# Patient Record
Sex: Female | Born: 1960 | Race: White | Hispanic: No | Marital: Married | Smoking: Never smoker
Health system: Southern US, Community
[De-identification: ages and names within clinical notes are randomized; demographics above are authoritative.]

## PROBLEM LIST (undated history)

## (undated) DIAGNOSIS — E785 Hyperlipidemia, unspecified: Secondary | ICD-10-CM

## (undated) DIAGNOSIS — E119 Type 2 diabetes mellitus without complications: Secondary | ICD-10-CM

## (undated) DIAGNOSIS — I10 Essential (primary) hypertension: Secondary | ICD-10-CM

## (undated) DIAGNOSIS — K7460 Unspecified cirrhosis of liver: Secondary | ICD-10-CM

## (undated) HISTORY — DX: Hyperlipidemia, unspecified: E78.5

## (undated) HISTORY — DX: Unspecified cirrhosis of liver: K74.60

---

## 2002-02-04 ENCOUNTER — Ambulatory Visit (HOSPITAL_COMMUNITY): Admission: RE | Admit: 2002-02-04 | Discharge: 2002-02-04 | Payer: Self-pay | Admitting: Family Medicine

## 2002-02-04 ENCOUNTER — Encounter: Payer: Self-pay | Admitting: Family Medicine

## 2004-01-22 ENCOUNTER — Ambulatory Visit (HOSPITAL_COMMUNITY): Admission: RE | Admit: 2004-01-22 | Discharge: 2004-01-22 | Payer: Self-pay | Admitting: Internal Medicine

## 2009-07-18 ENCOUNTER — Emergency Department (HOSPITAL_COMMUNITY): Admission: EM | Admit: 2009-07-18 | Discharge: 2009-07-18 | Payer: Self-pay | Admitting: Emergency Medicine

## 2010-05-16 ENCOUNTER — Observation Stay (HOSPITAL_COMMUNITY): Admission: EM | Admit: 2010-05-16 | Discharge: 2010-05-17 | Payer: Self-pay | Admitting: Emergency Medicine

## 2010-07-09 ENCOUNTER — Ambulatory Visit (HOSPITAL_COMMUNITY): Admission: RE | Admit: 2010-07-09 | Discharge: 2010-07-09 | Payer: Self-pay | Admitting: Cardiology

## 2010-12-20 LAB — BASIC METABOLIC PANEL
BUN: 7 mg/dL (ref 6–23)
CO2: 23 mEq/L (ref 19–32)
Calcium: 8.5 mg/dL (ref 8.4–10.5)
Chloride: 103 mEq/L (ref 96–112)
Creatinine, Ser: 0.67 mg/dL (ref 0.4–1.2)
GFR calc Af Amer: >60 mL/min (ref >60)
GFR calc non Af Amer: >60 mL/min (ref >60)
Glucose, Bld: 147 mg/dL — ABNORMAL HIGH (ref 70–99)
Potassium: 3.7 mEq/L (ref 3.5–5.1)
Sodium: 137 mEq/L (ref 135–145)

## 2010-12-20 LAB — DIFFERENTIAL
Eosinophils Relative: 1 % (ref 0–5)
Lymphocytes Relative: 12 % (ref 12–46)
Lymphs Abs: 1.4 10*3/uL (ref 0.7–4.0)
Neutro Abs: 10 10*3/uL — ABNORMAL HIGH (ref 1.7–7.7)
Neutrophils Relative %: 83 % — ABNORMAL HIGH (ref 43–77)

## 2010-12-20 LAB — CARDIAC PANEL(CRET KIN+CKTOT+MB+TROPI)
CK, MB: 0.7 ng/mL (ref 0.3–4.0)
Relative Index: INVALID (ref 0.0–2.5)
Total CK: 52 U/L (ref 7–177)
Troponin I: 0.01 ng/mL (ref 0.00–0.06)
Troponin I: 0.01 ng/mL (ref 0.00–0.06)

## 2010-12-20 LAB — CBC
MCV: 80 fL (ref 78.0–100.0)
Platelets: 274 10*3/uL (ref 150–400)
RBC: 5.05 MIL/uL (ref 3.87–5.11)
WBC: 12.1 10*3/uL — ABNORMAL HIGH (ref 4.0–10.5)

## 2010-12-20 LAB — LIPID PANEL
HDL: 36 mg/dL — ABNORMAL LOW (ref >39)
Total CHOL/HDL Ratio: 6.2 RATIO
Triglycerides: 453 mg/dL — ABNORMAL HIGH (ref <150)

## 2010-12-20 LAB — TSH: TSH: 2.302 u[IU]/mL (ref 0.350–4.500)

## 2010-12-20 LAB — POCT CARDIAC MARKERS
CKMB, poc: <1 ng/mL — ABNORMAL LOW (ref 1.0–8.0)
Myoglobin, poc: 47.3 ng/mL (ref 12–200)
Troponin i, poc: <0.05 ng/mL (ref 0.00–0.09)

## 2010-12-20 LAB — D-DIMER, QUANTITATIVE: D-Dimer, Quant: 0.22 ug/mL-FEU (ref 0.00–0.48)

## 2011-01-09 LAB — POCT CARDIAC MARKERS: Myoglobin, poc: 62.6 ng/mL (ref 12–200)

## 2011-09-04 ENCOUNTER — Other Ambulatory Visit (HOSPITAL_COMMUNITY): Payer: Self-pay | Admitting: Internal Medicine

## 2011-09-04 DIAGNOSIS — Z139 Encounter for screening, unspecified: Secondary | ICD-10-CM

## 2011-09-08 ENCOUNTER — Ambulatory Visit (HOSPITAL_COMMUNITY): Payer: Self-pay

## 2011-09-08 ENCOUNTER — Ambulatory Visit (HOSPITAL_COMMUNITY)
Admission: RE | Admit: 2011-09-08 | Discharge: 2011-09-08 | Disposition: A | Discharge status: Home / Self Care | Payer: BC Managed Care – PPO | Source: Ambulatory Visit | Attending: Internal Medicine | Admitting: Internal Medicine

## 2011-09-08 DIAGNOSIS — Z1231 Encounter for screening mammogram for malignant neoplasm of breast: Secondary | ICD-10-CM | POA: Insufficient documentation

## 2011-09-08 DIAGNOSIS — Z139 Encounter for screening, unspecified: Secondary | ICD-10-CM

## 2011-11-28 ENCOUNTER — Other Ambulatory Visit (HOSPITAL_COMMUNITY): Payer: Self-pay | Admitting: Internal Medicine

## 2011-11-28 ENCOUNTER — Ambulatory Visit (HOSPITAL_COMMUNITY)
Admission: RE | Admit: 2011-11-28 | Discharge: 2011-11-28 | Disposition: A | Discharge status: Home / Self Care | Payer: BC Managed Care – PPO | Source: Ambulatory Visit | Attending: Internal Medicine | Admitting: Internal Medicine

## 2011-11-28 DIAGNOSIS — R109 Unspecified abdominal pain: Secondary | ICD-10-CM

## 2011-11-28 DIAGNOSIS — R1011 Right upper quadrant pain: Secondary | ICD-10-CM | POA: Insufficient documentation

## 2011-11-28 DIAGNOSIS — R1031 Right lower quadrant pain: Secondary | ICD-10-CM | POA: Insufficient documentation

## 2012-10-06 HISTORY — PX: COLONOSCOPY: SHX174

## 2012-10-14 ENCOUNTER — Encounter (HOSPITAL_COMMUNITY): Payer: Self-pay | Admitting: Dietician

## 2012-10-14 NOTE — Progress Notes (Signed)
[facility] Diabetes Class Completion  Date:October 14, 2012  Time: 5:30 PM  Pt attended [facility]'s Diabetes Group Education Class on October 14, 2012.   Patient was educated on the following topics: survival skills (signs and symptoms of hyperglycemia and hypoglycemia, treatment for hypoglycemia, ideal levels for fasting and postprandial blood sugars, goal Hgb A1c level, foot care basics), recommendations for physical activity, carbohydrate metabolism in relation to diabetes, and meal planning (sources of carbohydrate, carbohydrate counting, meal planning strategies, food label reading, and portion control).   Melody Haver, RD, LDN

## 2013-01-11 ENCOUNTER — Telehealth: Payer: Self-pay

## 2013-01-11 NOTE — Telephone Encounter (Signed)
LM with spouse for pt to call.

## 2013-01-11 NOTE — Telephone Encounter (Signed)
Called, many rings and no answer.

## 2013-02-07 NOTE — Telephone Encounter (Signed)
Letter to pt and to PCP.  

## 2013-08-02 ENCOUNTER — Other Ambulatory Visit (HOSPITAL_COMMUNITY): Payer: Self-pay | Admitting: Physician Assistant

## 2013-08-02 DIAGNOSIS — Z139 Encounter for screening, unspecified: Secondary | ICD-10-CM

## 2013-08-15 ENCOUNTER — Ambulatory Visit (HOSPITAL_COMMUNITY)
Admission: RE | Admit: 2013-08-15 | Discharge: 2013-08-15 | Disposition: A | Discharge status: Home / Self Care | Payer: 59 | Source: Ambulatory Visit | Attending: Physician Assistant | Admitting: Physician Assistant

## 2013-08-15 DIAGNOSIS — Z139 Encounter for screening, unspecified: Secondary | ICD-10-CM

## 2013-08-15 DIAGNOSIS — Z1231 Encounter for screening mammogram for malignant neoplasm of breast: Secondary | ICD-10-CM | POA: Insufficient documentation

## 2015-11-05 ENCOUNTER — Encounter (HOSPITAL_COMMUNITY): Payer: Self-pay | Admitting: Emergency Medicine

## 2015-11-05 ENCOUNTER — Emergency Department (HOSPITAL_COMMUNITY)
Admission: EM | Admit: 2015-11-05 | Discharge: 2015-11-05 | Disposition: A | Discharge status: Home / Self Care | Payer: Managed Care, Other (non HMO) | Attending: Emergency Medicine | Admitting: Emergency Medicine

## 2015-11-05 ENCOUNTER — Emergency Department (HOSPITAL_COMMUNITY): Payer: Managed Care, Other (non HMO)

## 2015-11-05 DIAGNOSIS — E119 Type 2 diabetes mellitus without complications: Secondary | ICD-10-CM | POA: Insufficient documentation

## 2015-11-05 DIAGNOSIS — R110 Nausea: Secondary | ICD-10-CM | POA: Diagnosis not present

## 2015-11-05 DIAGNOSIS — M545 Low back pain: Secondary | ICD-10-CM | POA: Diagnosis not present

## 2015-11-05 DIAGNOSIS — M549 Dorsalgia, unspecified: Secondary | ICD-10-CM

## 2015-11-05 DIAGNOSIS — R300 Dysuria: Secondary | ICD-10-CM | POA: Diagnosis not present

## 2015-11-05 DIAGNOSIS — F419 Anxiety disorder, unspecified: Secondary | ICD-10-CM | POA: Diagnosis not present

## 2015-11-05 DIAGNOSIS — I10 Essential (primary) hypertension: Secondary | ICD-10-CM | POA: Insufficient documentation

## 2015-11-05 DIAGNOSIS — R52 Pain, unspecified: Secondary | ICD-10-CM

## 2015-11-05 HISTORY — DX: Type 2 diabetes mellitus without complications: E11.9

## 2015-11-05 HISTORY — DX: Essential (primary) hypertension: I10

## 2015-11-05 LAB — URINALYSIS, ROUTINE W REFLEX MICROSCOPIC
BILIRUBIN URINE: NEGATIVE
Glucose, UA: 100 mg/dL — AB
Leukocytes, UA: NEGATIVE
NITRITE: NEGATIVE
PH: 5 (ref 5.0–8.0)

## 2015-11-05 LAB — URINE MICROSCOPIC-ADD ON

## 2015-11-05 MED ORDER — CYCLOBENZAPRINE HCL 10 MG PO TABS: 10.0000 mg | ORAL_TABLET | Freq: Two times a day (BID) | ORAL | Status: DC | PRN

## 2015-11-05 MED ORDER — ONDANSETRON HCL 4 MG/2ML IJ SOLN
4.0000 mg | Freq: Once | INTRAMUSCULAR | Status: AC
  Administered 2015-11-05: 4 mg via INTRAVENOUS
  Filled 2015-11-05: qty 2

## 2015-11-05 MED ORDER — HYDROMORPHONE HCL 1 MG/ML IJ SOLN
1.0000 mg | Freq: Once | INTRAMUSCULAR | Status: AC
  Administered 2015-11-05: 1 mg via INTRAVENOUS
  Filled 2015-11-05: qty 1

## 2015-11-05 NOTE — ED Provider Notes (Signed)
CSN: [id number]     Arrival date & time 11/05/15  0618 History   First MD Initiated Contact with Patient 11/05/15 [phone]     Chief Complaint  Patient presents with  . Back Pain     Patient is a 55 y.o. female presenting with back pain. The history is provided by the patient and a significant other.  Back Pain Pain location: "low back" Quality:  Unable to specify Radiates to:  Does not radiate Pain severity:  Severe Onset quality:  Gradual Duration:  1 day Timing:  Constant Progression:  Worsening Chronicity:  New Relieved by:  Nothing Worsened by:  Nothing tried Associated symptoms: dysuria   Associated symptoms: no abdominal pain, no chest pain, no fever and no weakness   pt reports "nagging" back pain last week, thought she had UTI as she also had painful urination.  She took AZO with some relief.  However yesterday the BP returned and was very severe.  She does not recall having this pain before.   No fever/vomiting No new weakness reported No falls/injury reported  Past Medical History  Diagnosis Date  . Diabetes mellitus without complication (HCC)   . Hypertension    Past Surgical History  Procedure Laterality Date  . C-section x 2     History reviewed. No pertinent family history. Social History  Substance Use Topics  . Smoking status: Never Smoker   . Smokeless tobacco: None  . Alcohol Use: None   OB History    No data available     Review of Systems  Constitutional: Negative for fever.  Cardiovascular: Negative for chest pain.  Gastrointestinal: Positive for nausea. Negative for abdominal pain.  Genitourinary: Positive for dysuria.  Musculoskeletal: Positive for back pain.  Neurological: Negative for weakness.  All other systems reviewed and are negative.     Allergies  Review of patient's allergies indicates no known allergies.  Home Medications   Prior to Admission medications   Not on File   BP 166/104 mmHg  Pulse 118  Temp(Src) 98.5 F  (36.9 C) (Oral)  Resp 22  Ht  (1.499 m)  Wt 79.833 kg  BMI 35.53 kg/m2  SpO2 100%  LMP 10/24/2015 Physical Exam CONSTITUTIONAL: Well developed/well nourished HEAD: Normocephalic/atraumatic EYES: EOMI ENMT: Mucous membranes moist NECK: supple no meningeal signs SPINE/BACK:entire spine nontender, tenderness along right paraspinal region, no bruising or erythema noted CV: S1/S2 noted, no murmurs/rubs/gallops noted LUNGS: Lungs are clear to auscultation bilaterally, no apparent distress ABDOMEN: soft, nontender, no rebound or guarding, bowel sounds noted throughout abdomen GU:no cva tenderness NEURO: Pt is awake/alert/appropriate, moves all extremitiesx4.  No focal motor weakness noted to lower extremities, equal strength in both lower extremities EXTREMITIES: pulses normal/equal, full ROM SKIN: warm, color normal PSYCH: anxious  ED Course  Procedures  Initial exam difficult as pt very uncomfortable, unclear if this was ureteral stone vs. MSK pain CT scan negative Pt more comfortable Suspect musculoskeletal in nature Will give muscle relaxants No focal neuro deficits She is appropriate for d/c home Discussed strict return precautions Urine culture ordered, but will defer ABX for now  Labs Review Labs Reviewed  URINALYSIS, ROUTINE W REFLEX MICROSCOPIC (NOT AT [facility]) - Abnormal; Notable for the following:    Specific Gravity, Urine >1.030 (*)    Glucose, UA 100 (*)    Hgb urine dipstick SMALL (*)    Ketones, ur TRACE (*)    Protein, ur TRACE (*)    All other components within normal limits  URINE MICROSCOPIC-ADD ON - Abnormal; Notable for the following:    Squamous Epithelial / LPF 0-5 (*)    Bacteria, UA MANY (*)    All other components within normal limits  URINE CULTURE    Imaging Review Ct Renal Stone Study  11/05/2015  CLINICAL DATA:  Right flank pain for 1 day with macroscopic hematuria 11/28/2011 EXAM: CT ABDOMEN AND PELVIS WITHOUT CONTRAST TECHNIQUE:  Multidetector CT imaging of the abdomen and pelvis was performed following the standard protocol without IV contrast. COMPARISON:  None. FINDINGS: Lower chest:  Normal Hepatobiliary: Negative Pancreas: Negative Spleen: Multiple tiny low-attenuation foci unchanged and likely not of acute clinical significance Adrenals/Urinary Tract: Negative Stomach/Bowel: Tiny stone in the base the appendix with no evidence of dilatation or inflammation. Small and large bowel normal, as is the stomach Vascular/Lymphatic: Minimal aortic calcification. Mild iliac calcification. Reproductive: Stable 3.5 cm low-attenuation mass lower uterine segment possibly a fibroid not fully characterized without contrast Other: No ascites Musculoskeletal: No acute findings IMPRESSION: No acute abnormalities or findings to account for the patient's symptoms. Multiple nonacute findings described above. Electronically Signed   By: Esperanza Heir M.D.   On: 11/05/2015 08:01   I have personally reviewed and evaluated these lab results as part of my medical decision-making.   Medications  HYDROmorphone (DILAUDID) injection 1 mg (1 mg Intravenous Given 11/05/15 0717)  ondansetron (ZOFRAN) injection 4 mg (4 mg Intravenous Given 11/05/15 0717)  HYDROmorphone (DILAUDID) injection 1 mg (1 mg Intravenous Given 11/05/15 0811)    MDM   Final diagnoses:  Pain  Acute back pain    Nursing notes including past medical history and social history reviewed and considered in documentation Labs/vital reviewed myself and considered during evaluation     Zadie Rhine, MD 11/05/15 1610

## 2015-11-05 NOTE — Discharge Instructions (Signed)

## 2015-11-05 NOTE — ED Notes (Signed)
Pt states she has been having back pain since yesterday morning. States she thought she had a urinary tract infection last Thursday due to painful urination so she took some OTC AZO and "those symptoms went away".

## 2015-11-06 LAB — URINE CULTURE

## 2016-01-23 ENCOUNTER — Other Ambulatory Visit: Payer: Self-pay | Admitting: Registered Nurse

## 2016-01-23 DIAGNOSIS — Z1231 Encounter for screening mammogram for malignant neoplasm of breast: Secondary | ICD-10-CM

## 2016-01-24 ENCOUNTER — Ambulatory Visit
Admission: RE | Admit: 2016-01-24 | Discharge: 2016-01-24 | Disposition: A | Discharge status: Home / Self Care | Payer: Managed Care, Other (non HMO) | Source: Ambulatory Visit | Attending: Registered Nurse | Admitting: Registered Nurse

## 2016-01-24 DIAGNOSIS — Z1231 Encounter for screening mammogram for malignant neoplasm of breast: Secondary | ICD-10-CM

## 2016-04-09 ENCOUNTER — Encounter (HOSPITAL_COMMUNITY): Payer: Self-pay | Admitting: Emergency Medicine

## 2016-04-09 ENCOUNTER — Emergency Department (HOSPITAL_COMMUNITY)
Admission: EM | Admit: 2016-04-09 | Discharge: 2016-04-09 | Disposition: A | Discharge status: Home / Self Care | Payer: Managed Care, Other (non HMO) | Attending: Emergency Medicine | Admitting: Emergency Medicine

## 2016-04-09 DIAGNOSIS — Z79899 Other long term (current) drug therapy: Secondary | ICD-10-CM | POA: Insufficient documentation

## 2016-04-09 DIAGNOSIS — E119 Type 2 diabetes mellitus without complications: Secondary | ICD-10-CM | POA: Insufficient documentation

## 2016-04-09 DIAGNOSIS — I10 Essential (primary) hypertension: Secondary | ICD-10-CM | POA: Insufficient documentation

## 2016-04-09 DIAGNOSIS — Z7984 Long term (current) use of oral hypoglycemic drugs: Secondary | ICD-10-CM | POA: Insufficient documentation

## 2016-04-09 DIAGNOSIS — M5441 Lumbago with sciatica, right side: Secondary | ICD-10-CM | POA: Insufficient documentation

## 2016-04-09 DIAGNOSIS — M5431 Sciatica, right side: Secondary | ICD-10-CM

## 2016-04-09 MED ORDER — OXYCODONE-ACETAMINOPHEN 5-325 MG PO TABS
1.0000 | ORAL_TABLET | Freq: Once | ORAL | Status: AC
  Administered 2016-04-09: 1 via ORAL
  Filled 2016-04-09: qty 1

## 2016-04-09 MED ORDER — NAPROXEN 500 MG PO TABS: 500.0000 mg | ORAL_TABLET | Freq: Two times a day (BID) | ORAL | Status: DC

## 2016-04-09 MED ORDER — CYCLOBENZAPRINE HCL 10 MG PO TABS: 10.0000 mg | ORAL_TABLET | Freq: Two times a day (BID) | ORAL | Status: DC | PRN

## 2016-04-09 MED ORDER — OXYCODONE-ACETAMINOPHEN 5-325 MG PO TABS: 2.0000 | ORAL_TABLET | ORAL | Status: DC | PRN

## 2016-04-09 NOTE — ED Provider Notes (Signed)
CSN: [id number]     Arrival date & time 04/09/16  1131 History  By signing my name below, I, Freida Busmaniana Omoyeni, attest that this documentation has been prepared under the direction and in the presence of non-physician practitioner, Gaylyn RongSamantha Ellakate Gonsalves, PA-C. Electronically Signed: Freida Busmaniana Omoyeni, Scribe. 04/09/2016. 12:16 PM.  Chief Complaint  Patient presents with  . Hip Pain   The history is provided by the patient. No language interpreter was used.    HPI Comments:  [facility] is a 55 y.o. female who presents to the Emergency Department complaining of gradually worsening, lower back pain and right hip pain that radiates down her RLE x ~4 days. She denies recent fall/injury. Pt has been able to ambulate but reports increased pain when doing so. She reports associated tingling in her right hip/thigh area. She has been taking ibuprofen with mild relief  She reports h/o similar episode ~ 7 months ago; states she was evaluated and treated with steroids and muscle relaxer with relief. She denies bowel/bladder incontinence, LLE pain/numbness, fever and chills.   Past Medical History  Diagnosis Date  . Diabetes mellitus without complication (HCC)   . Hypertension    Past Surgical History  Procedure Laterality Date  . C-section x 2     No family history on file. Social History  Substance Use Topics  . Smoking status: Never Smoker   . Smokeless tobacco: None  . Alcohol Use: No   OB History    No data available     Review of Systems  10 systems reviewed and all are negative for acute change except as noted in the HPI.  Allergies  Review of patient's allergies indicates no known allergies.  Home Medications   Prior to Admission medications   Medication Sig Start Date End Date Taking? Authorizing Provider  atorvastatin (LIPITOR) 10 MG tablet Take 1 tablet by mouth daily. 10/06/15   Historical Provider, MD  Coenzyme Q10 (CO Q 10 PO) Take 2 tablets by mouth daily.    Historical Provider,  MD  cyclobenzaprine (FLEXERIL) 10 MG tablet Take 1 tablet (10 mg total) by mouth 2 (two) times daily as needed for muscle spasms. 11/05/15   Zadie Rhineonald Wickline, MD  cyclobenzaprine (FLEXERIL) 10 MG tablet Take 1 tablet (10 mg total) by mouth 2 (two) times daily as needed for muscle spasms. 04/09/16   Keano Guggenheim Tripp Mekaylah Klich, PA-C  enalapril-hydrochlorothiazide (VASERETIC) 10-25 MG tablet Take 1 tablet by mouth daily. 10/06/15   Historical Provider, MD  ibuprofen (ADVIL,MOTRIN) 200 MG tablet Take 400-800 mg by mouth every 6 (six) hours as needed for headache or moderate pain.    Historical Provider, MD  metFORMIN (GLUCOPHAGE-XR) 500 MG 24 hr tablet Take 500 mg by mouth 3 (three) times daily. 10/04/15   Historical Provider, MD  naproxen (NAPROSYN) 500 MG tablet Take 1 tablet (500 mg total) by mouth 2 (two) times daily. 04/09/16   Norah Devin Tripp Genevive Printup, PA-C  oxyCODONE-acetaminophen (PERCOCET/ROXICET) 5-325 MG tablet Take 2 tablets by mouth every 4 (four) hours as needed for severe pain. 04/09/16   Valyn Latchford Tripp Kilian Schwartz, PA-C   BP 149/68 mmHg  Pulse 81  Temp(Src) 98.2 F (36.8 C) (Oral)  Resp 20  SpO2 100%  LMP 03/31/2016 Physical Exam  Constitutional: She is oriented to person, place, and time. She appears well-developed and well-nourished. No distress.  HENT:  Head: Normocephalic and atraumatic.  Eyes: Conjunctivae are normal. Right eye exhibits no discharge. Left eye exhibits no discharge. No scleral icterus.  Cardiovascular:  Normal rate.   Pulmonary/Chest: Effort normal.  Musculoskeletal: Normal range of motion. She exhibits tenderness.  No midline spinal tenderness TTP over right lumbar paraspinal muscles and gluteus FROM of C/T/L spine  No step off or obvious deformity   Neurological: She is alert and oriented to person, place, and time. Coordination normal.  Strength 5/5 throughout. No sensory deficits. No gait abnormality  Skin: Skin is warm and dry. No rash noted. She is not diaphoretic.  No erythema. No pallor.  Psychiatric: She has a normal mood and affect. Her behavior is normal.  Nursing note and vitals reviewed.   ED Course  Procedures   DIAGNOSTIC STUDIES:  Oxygen Saturation is 100% on RA, normal by my interpretation.    COORDINATION OF CARE:  12:00 PM Discussed treatment plan with pt at bedside and pt agreed to plan.   MDM   Final diagnoses:  Sciatica of right side   Patient with back pain and hip pain, similar to previous episodes of sciatica.  No neurological deficits and normal neuro exam.  Patient is ambulatory. No loss of bowel or bladder control.  No concern for cauda equina.  No fever, night sweats, weight loss, h/o cancer, IVDA, no recent procedure to back. No urinary symptoms suggestive of UTI.  Pain managed in ED. Rx for Flexeril, percocet given. Recommend NSAIDS. Supportive care and return precaution discussed. Appears safe for discharge at this time. Follow up as indicated in discharge paperwork.   I personally performed the services described in this documentation, which was scribed in my presence. The recorded information has been reviewed and is accurate.     Lester KinsmanSamantha Tripp ChallisDowless, PA-C 04/09/16 1600  Glynn OctaveStephen Rancour, MD 04/09/16 [phone]

## 2016-04-09 NOTE — Discharge Instructions (Signed)
Sciatica °Sciatica is pain, weakness, numbness, or tingling along the path of the sciatic nerve. The nerve starts in the lower back and runs down the back of each leg. The nerve controls the muscles in the lower leg and in the back of the knee, while also providing sensation to the back of the thigh, lower leg, and the sole of your foot. Sciatica is a symptom of another medical condition. For instance, nerve damage or certain conditions, such as a herniated disk or bone spur on the spine, pinch or put pressure on the sciatic nerve. This causes the pain, weakness, or other sensations normally associated with sciatica. Generally, sciatica only affects one side of the body. °CAUSES  °· Herniated or slipped disc. °· Degenerative disk disease. °· A pain disorder involving the narrow muscle in the buttocks (piriformis syndrome). °· Pelvic injury or fracture. °· Pregnancy. °· Tumor (rare). °SYMPTOMS  °Symptoms can vary from mild to very severe. The symptoms usually travel from the low back to the buttocks and down the back of the leg. Symptoms can include: °· Mild tingling or dull aches in the lower back, leg, or hip. °· Numbness in the back of the calf or sole of the foot. °· Burning sensations in the lower back, leg, or hip. °· Sharp pains in the lower back, leg, or hip. °· Leg weakness. °· Severe back pain inhibiting movement. °These symptoms may get worse with coughing, sneezing, laughing, or prolonged sitting or standing. Also, being overweight may worsen symptoms. °DIAGNOSIS  °Your caregiver will perform a physical exam to look for common symptoms of sciatica. He or she may ask you to do certain movements or activities that would trigger sciatic nerve pain. Other tests may be performed to find the cause of the sciatica. These may include: °· Blood tests. °· X-rays. °· Imaging tests, such as an MRI or CT scan. °TREATMENT  °Treatment is directed at the cause of the sciatic pain. Sometimes, treatment is not necessary  and the pain and discomfort goes away on its own. If treatment is needed, your caregiver may suggest: °· Over-the-counter medicines to relieve pain. °· Prescription medicines, such as anti-inflammatory medicine, muscle relaxants, or narcotics. °· Applying heat or ice to the painful area. °· Steroid injections to lessen pain, irritation, and inflammation around the nerve. °· Reducing activity during periods of pain. °· Exercising and stretching to strengthen your abdomen and improve flexibility of your spine. Your caregiver may suggest losing weight if the extra weight makes the back pain worse. °· Physical therapy. °· Surgery to eliminate what is pressing or pinching the nerve, such as a bone spur or part of a herniated disk. °HOME CARE INSTRUCTIONS  °· Only take over-the-counter or prescription medicines for pain or discomfort as directed by your caregiver. °· Apply ice to the affected area for 20 minutes, 3-4 times a day for the first 48-72 hours. Then try heat in the same way. °· Exercise, stretch, or perform your usual activities if these do not aggravate your pain. °· Attend physical therapy sessions as directed by your caregiver. °· Keep all follow-up appointments as directed by your caregiver. °· Do not wear high heels or shoes that do not provide proper support. °· Check your mattress to see if it is too soft. A firm mattress may lessen your pain and discomfort. °SEEK IMMEDIATE MEDICAL CARE IF:  °· You lose control of your bowel or bladder (incontinence). °· You have increasing weakness in the lower back, pelvis, buttocks,   or legs.  You have redness or swelling of your back.  You have a burning sensation when you urinate.  You have pain that gets worse when you lie down or awakens you at night.  Your pain is worse than you have experienced in the past.  Your pain is lasting longer than 4 weeks.  You are suddenly losing weight without reason. MAKE SURE YOU:  Understand these  instructions.  Will watch your condition.  Will get help right away if you are not doing well or get worse.   This information is not intended to replace advice given to you by your health care provider. Make sure you discuss any questions you have with your health care provider.  Apply ice to affected area. Take percocet as needed for pain. Take Flexeril for muscle relaxation. This may make you feel drowsy. Do not drive while taking Flexeril or Percocet. Take naprosyn twice daily for inflammation. Encourage stretching and massaging of back. Return to the ED if you experience severe worsening of your pain, loss of control of your bowels or bladder, numbness/tingling in both of your lower extremities, difficulty urinating or fevers.

## 2016-04-09 NOTE — ED Notes (Signed)
PT reports right hip pain that radiates down right leg. PT denies any injury or fall. PT reports pain started Sunday and has not improved at all since then.

## 2016-04-09 NOTE — ED Notes (Signed)
PT made aware that she cannot drive or operate machinery for the rest of the day or while continuing to use percocet or flexiril for pain.

## 2017-03-03 IMAGING — CT CT RENAL STONE PROTOCOL
2 of 4 series · 17 of 46 positions shown, 19 images · non-contrast
Comparison: None.

CLINICAL DATA: Right flank pain for 1 day with macroscopic
hematuria 11/28/2011

EXAM:
CT ABDOMEN AND PELVIS WITHOUT CONTRAST
TECHNIQUE: Multidetector CT imaging of the abdomen and pelvis was performed
following the standard protocol without IV contrast.

[Series 2: standard/full over (age)lbs 5.0 · axial · 0.77mm/px · z∈[-427,-2]mm · 14 of 95 slices shown, 16 images]
[im 5/95  soft-tissue]
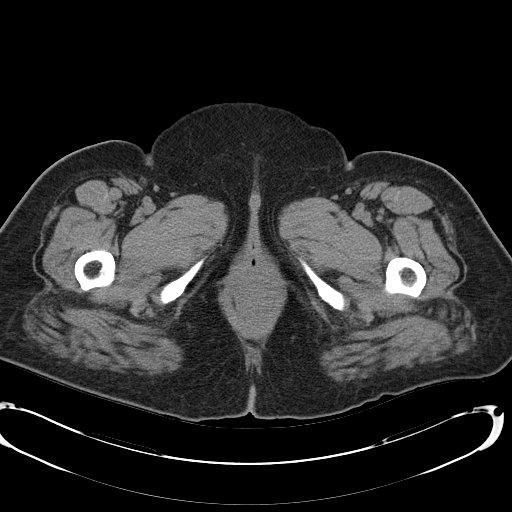
[im 5/95  bone]
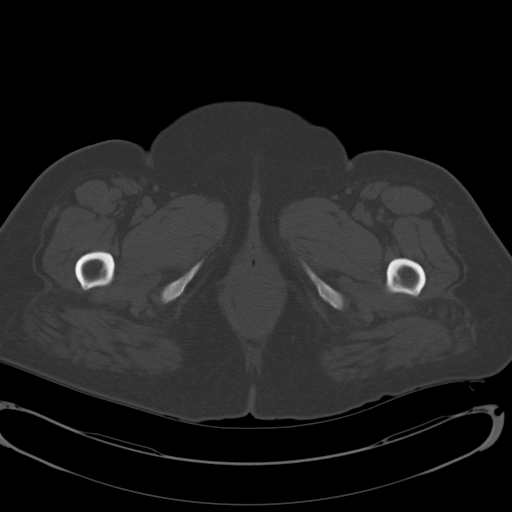
[im 13/95  soft-tissue]
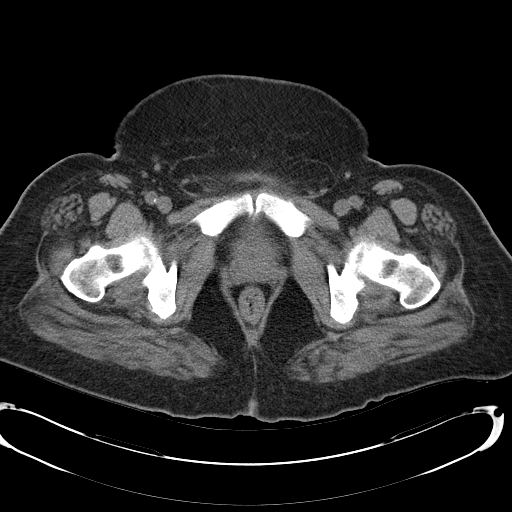
[im 17/95  soft-tissue]
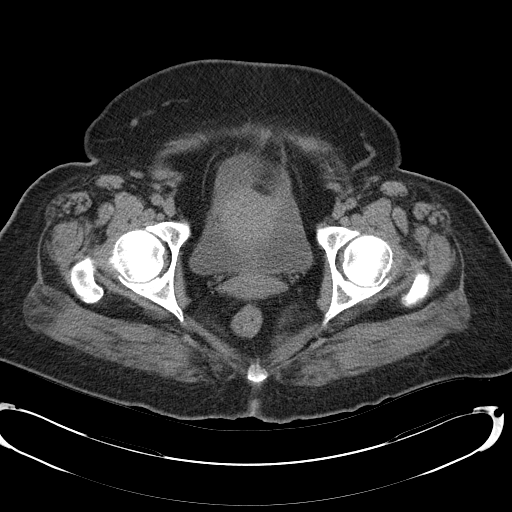
[im 25/95  soft-tissue]
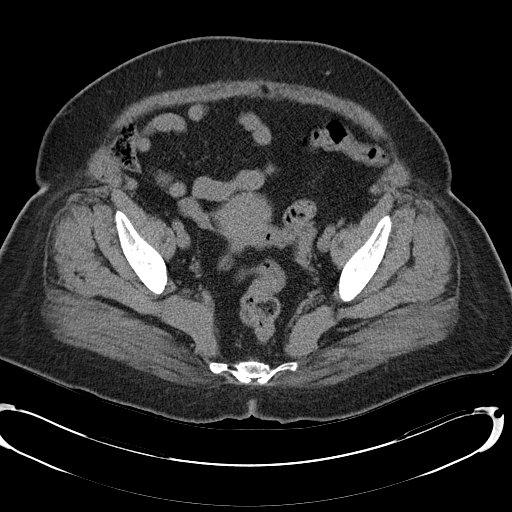
[im 33/95  soft-tissue]
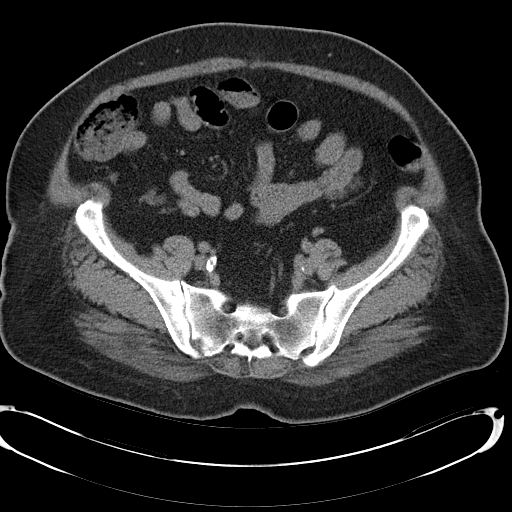
[im 37/95  soft-tissue]
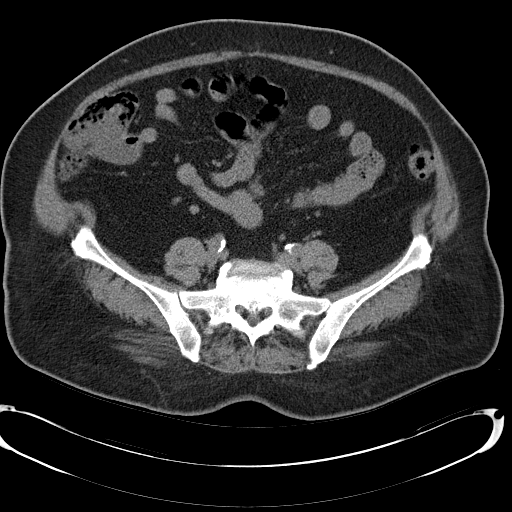
[im 45/95  soft-tissue]
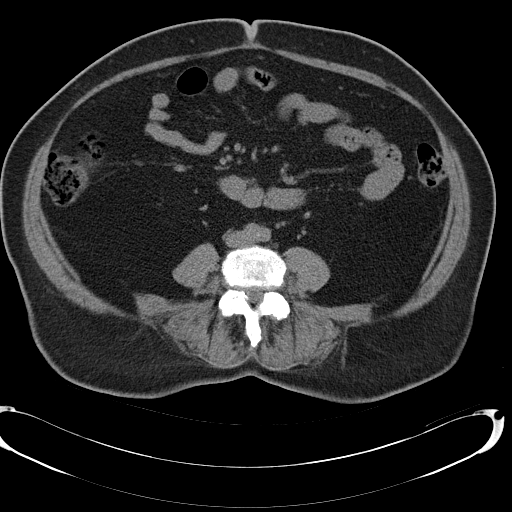
[im 50/95  soft-tissue]
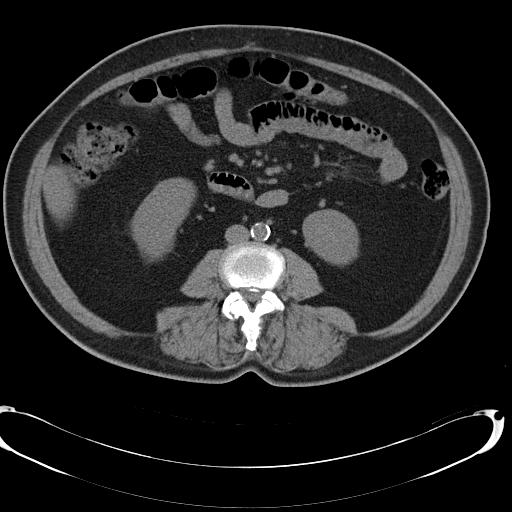
[im 58/95  soft-tissue]
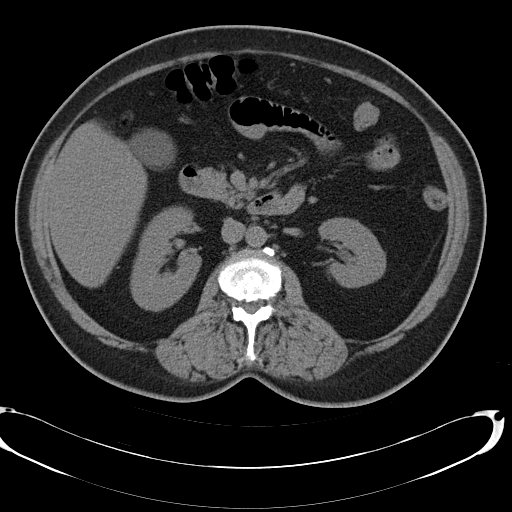
[im 58/95  bone]
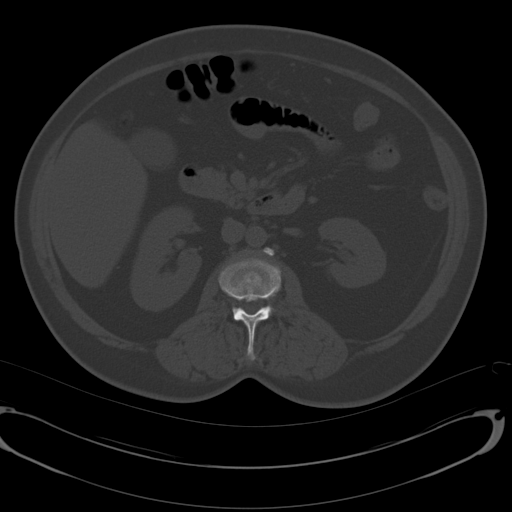
[im 62/95  soft-tissue]
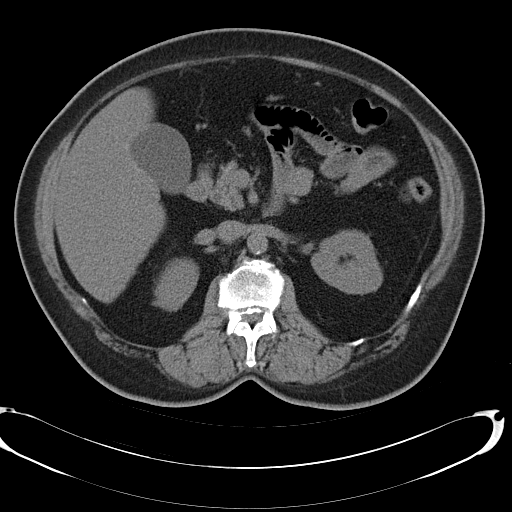
[im 70/95  soft-tissue]
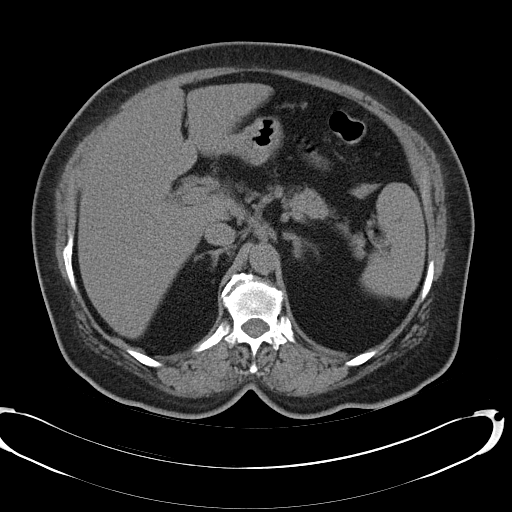
[im 78/95  soft-tissue]
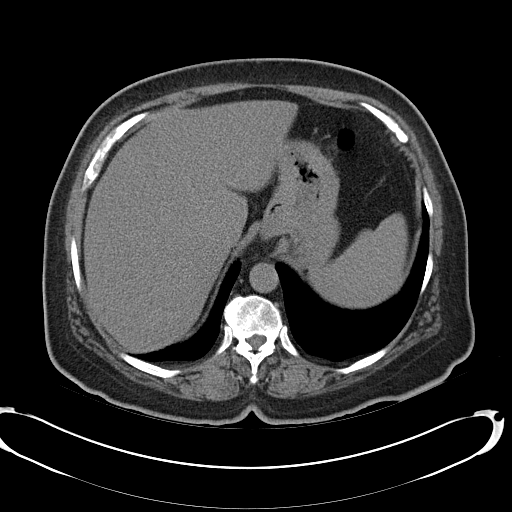
[im 82/95  soft-tissue]
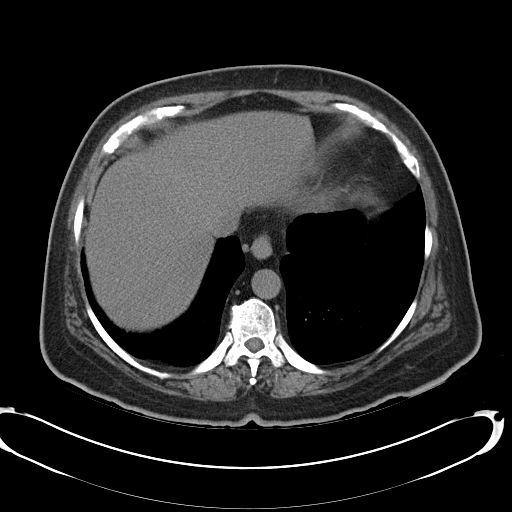
[im 90/95  soft-tissue]
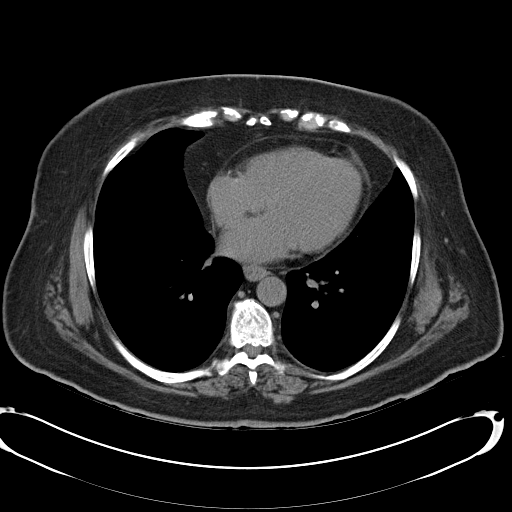

[Series 3: mpr coronal · coronal · 0.78mm/px · 3 of 102 slices shown]
[im 34/102  soft-tissue]
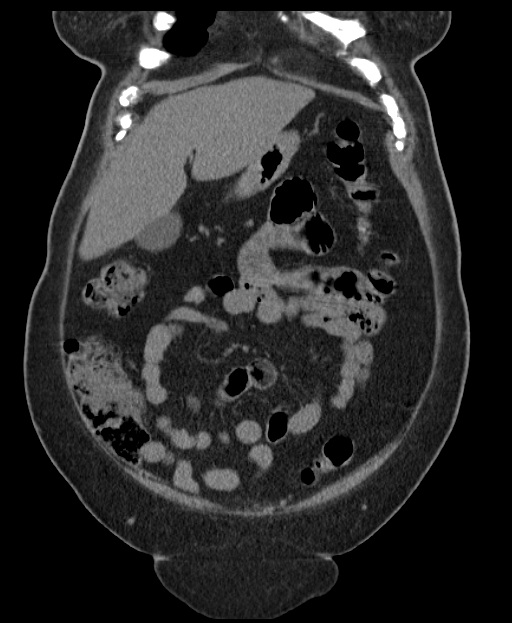
[im 45/102  soft-tissue]
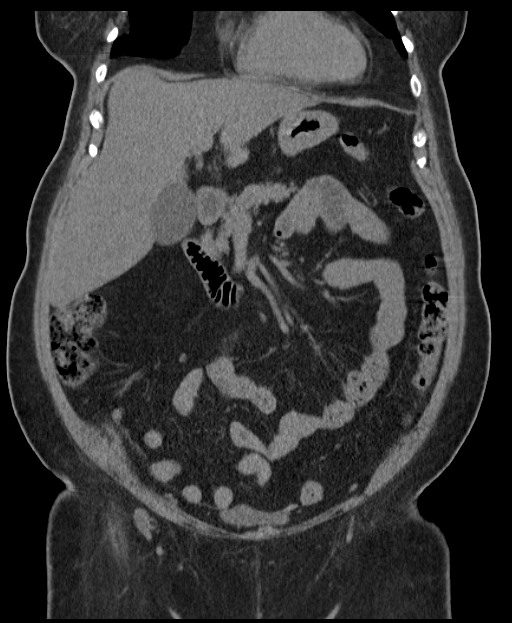
[im 57/102  soft-tissue]
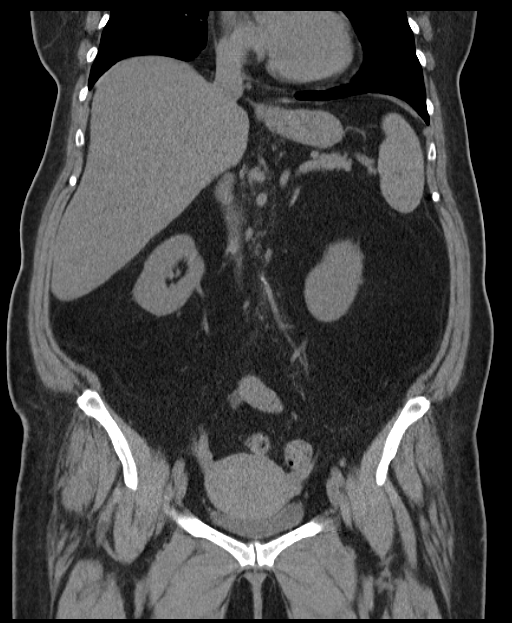

[17 of 46 positions shown; findings below may reference images not displayed]

FINDINGS: Lower chest:  Normal

Hepatobiliary: Negative

Pancreas: Negative

Spleen: Multiple tiny low-attenuation foci unchanged and likely not
of acute clinical significance

Adrenals/Urinary Tract: Negative

Stomach/Bowel: Tiny stone in the base the appendix with no evidence
of dilatation or inflammation. Small and large bowel normal, as is
the stomach

Vascular/Lymphatic: Minimal aortic calcification. Mild iliac
calcification.

Reproductive: Stable 3.5 cm low-attenuation mass lower uterine
segment possibly a fibroid not fully characterized without contrast

Other: No ascites

Musculoskeletal: No acute findings
IMPRESSION: No acute abnormalities or findings to account for the patient's
symptoms. Multiple nonacute findings described above.

## 2020-08-06 HISTORY — PX: OTHER SURGICAL HISTORY: SHX169

## 2020-12-24 ENCOUNTER — Other Ambulatory Visit (HOSPITAL_COMMUNITY): Payer: Self-pay | Admitting: Internal Medicine

## 2020-12-24 DIAGNOSIS — R7989 Other specified abnormal findings of blood chemistry: Secondary | ICD-10-CM

## 2020-12-24 DIAGNOSIS — R945 Abnormal results of liver function studies: Secondary | ICD-10-CM

## 2020-12-31 ENCOUNTER — Ambulatory Visit (HOSPITAL_COMMUNITY)
Admission: RE | Admit: 2020-12-31 | Discharge: 2020-12-31 | Disposition: A | Discharge status: Home / Self Care | Payer: 59 | Source: Ambulatory Visit | Attending: Internal Medicine | Admitting: Internal Medicine

## 2020-12-31 DIAGNOSIS — R7989 Other specified abnormal findings of blood chemistry: Secondary | ICD-10-CM

## 2020-12-31 DIAGNOSIS — R945 Abnormal results of liver function studies: Secondary | ICD-10-CM | POA: Insufficient documentation

## 2021-01-09 ENCOUNTER — Encounter: Payer: Self-pay | Admitting: Internal Medicine

## 2021-02-04 NOTE — Progress Notes (Signed)
Referring Provider: Redmond School, MD Primary Care Physician:  Redmond School, MD Primary Gastroenterologist:  Dr. Gala Romney  Chief Complaint  Patient presents with  . Cirrhosis    possible    HPI:   Caroline Solis is a 60 y.o. female presenting today at the request of Redmond School, MD for possible cirrhosis.  Abdominal ultrasound on file 12/31/2020 for elevated LFTs.  No focal liver lesion.  Liver echogenicity is somewhat coarsened and increased.  Subtle nodularity along the liver contour.  Patent portal vein.  Spleen normal.  No ascites.   Chronic history of fatty liver. Previously noted on [place] in October 2011 with hepatomegaly at that time.   Review plan referral completed 12/19/2020. 13.9, platelets 279.  AST 48 (H), ALT 57 (H), alk phos 61, total bilirubin 0.4.  Kidney function and electrolytes within normal limits.  Glucose elevated at 135. Hepatitis A antibody IgM negative, hepatitis B surface antigen negative, hepatitis B core antibody IgM negative, hepatitis C antibody.  Today:  No history of liver disease.  States primary care has been following mildly elevated liver enzymes for over a year.  Also secondary to Lipitor.  No personal history of alcohol or drug use.  Refer does have history of alcoholic cirrhosis.  He is deceased.  She does take a few over-the-counter supplements which are listed on her medication list.  No herbal teas.  No regular Tylenol use.  Ibuprofen a couple times a week for knee and foot pain.  No family history of autoimmune conditions.   Denies swelling in the lower extremities, abdomen, yellowing of the eyes or skin, changes in mental status or confusion, itching, rash, easy bruising, blood per rectum, or melena.  Last colonoscopy 6-7 years ago at [place]. Had a few polyps. She was told to come back in 10 years.  EGD many many years ago for reflux.   Denies GERD symptoms or dysphagia.  Denies constipation or diarrhea.   Past Medical History:   Diagnosis Date  . Diabetes mellitus without complication ([place])   . HLD (hyperlipidemia)   . Hypertension     Past Surgical History:  Procedure Laterality Date  . c-section X 2    . COLONOSCOPY     [facility]  . right ankle surgery  08/2020   screws and plate in place    Current Outpatient Medications  Medication Sig Dispense Refill  . aspirin EC 81 MG tablet Take 81 mg by mouth daily. Swallow whole.    Marland Kitchen atorvastatin (LIPITOR) 20 MG tablet Take 20 mg by mouth daily.    Marland Kitchen BIOTIN 5000 PO Take 2 capsules by mouth daily.    . calcium carbonate (CALCIUM 600) 600 MG TABS tablet Take 1,200 mg by mouth daily.    . Cholecalciferol (VITAMIN D3) 125 MCG (5000 UT) TABS Take 1 tablet by mouth daily.    . Coenzyme Q10 (CO Q 10 PO) Take 2 tablets by mouth daily.    . enalapril (VASOTEC) 20 MG tablet Take 20 mg by mouth daily.    . hydrochlorothiazide (HYDRODIURIL) 25 MG tablet Take 1 tablet by mouth daily.    Marland Kitchen ibuprofen (ADVIL,MOTRIN) 200 MG tablet Take 400-800 mg by mouth every 6 (six) hours as needed for headache or moderate pain.    . metFORMIN (GLUCOPHAGE-XR) 500 MG 24 hr tablet Take 1,000 mg by mouth 2 (two) times daily.  0  . Omega-3 Fatty Acids (FISH OIL) 1000 MG CAPS Take 3 capsules by mouth daily.    Marland Kitchen  vitamin k 100 MCG tablet Take 100 mcg by mouth daily.    . Zinc 50 MG TABS Take 1 tablet by mouth daily.     No current facility-administered medications for this visit.    Allergies as of 02/06/2021  . (No Known Allergies)    Family History  Problem Relation Age of Onset  . Cirrhosis Father        secondary to alcohol  . Colon cancer Neg Hx     Social History   Socioeconomic History  . Marital status: Married    Spouse name: Not on file  . Number of children: Not on file  . Years of education: Not on file  . Highest education level: Not on file  Occupational History  . Not on file  Tobacco Use  . Smoking status: Never Smoker  . Smokeless tobacco: Never Used   Substance and Sexual Activity  . Alcohol use: No  . Drug use: No  . Sexual activity: Not on file  Other Topics Concern  . Not on file  Social History Narrative  . Not on file   Social Determinants of Health   Financial Resource Strain: Not on file  Food Insecurity: Not on file  Transportation Needs: Not on file  Physical Activity: Not on file  Stress: Not on file  Social Connections: Not on file  Intimate Partner Violence: Not on file    Review of Systems: Gen: Denies any fever, chills, cold or flulike symptoms, presyncope, syncope.    CV: Denies chest pain or heart palpitations Resp: Denies shortness of breath cough.  GI: See HPI GU : Denies urinary burning, urinary frequency, urinary hesitancy MS: See HPI Derm: Denies rash or itching Psych: Denies depression or anxiety Heme: See HPI  Physical Exam: BP 139/89   Pulse 100   Temp (!) 96.8 F (36 C) (Temporal)   Ht 5' (1.524 m)   Wt 152 lb 3.2 oz (69 kg)   LMP 03/31/2016   BMI 29.72 kg/m  General:   Alert and oriented. Pleasant and cooperative. Well-nourished and well-developed.  Head:  Normocephalic and atraumatic. Eyes:  Without icterus, sclera clear and conjunctiva pink.  Ears:  Normal auditory acuity. Lungs:  Clear to auscultation bilaterally. No wheezes, rales, or rhonchi. No distress.  Heart:  S1, S2 present without murmurs appreciated.  Abdomen:  +BS, soft, non-tender and non-distended. No HSM noted. No guarding or rebound. No masses appreciated.  Rectal:  Deferred  Msk:  Symmetrical without gross deformities. Normal posture. Extremities:  Without edema. Neurologic:  Alert and  oriented x4;  grossly normal neurologically. Skin:  Intact without significant lesions or rashes. Psych:  Normal mood and affect.   Assessment: 60 year old female with history of diabetes, HTN, HLD presenting today for request of Dr. Gerarda Fraction for further evaluation of elevated LFTs and possible cirrhosis.  Labs including referral  revealed AST 48 (H), ALT 57 (H), alk phos 61, total bilirubin 0.4, platelets within normal limits.  Acute hepatitis panel with A, B, and C negative.  Abdominal ultrasound on file 12/31/2020 with liver echogenicity somewhat coarsened and increased, subtle nodularity along the liver contour, patent portal vein, spleen normal, no ascites.  Prior ultrasound in October 2011 with hepatomegaly and fatty liver. Clinically, patient has no signs or symptoms of decompensated liver disease.  No other significant upper or lower GI symptoms.  No history of alcohol or drug use.  Family history significant for father with alcoholic cirrhosis.  No known family history of autoimmune  conditions or other liver disease.    Suspect early cirrhosis and elevated LFTs secondary to NASH. Lipitor may also be contributing to elevated LFTs. We will update cirrhosis labs and screen for hemochromatosis and autoimmune conditions.  We will also evaluate for hepatitis A and B immunity.  Recommend EGD to evaluate for evidence of portal hypertension/esophageal varices.  We will hold off on scheduling this until reviewing prior colonoscopy records as she may be due for repeat colonoscopy as well.   History of colon polyps: Patient reports a personal history of colon polyps with last colonoscopy 6 or 7 years ago at [place].  Reports she was told to repeat colonoscopy in 10 years.  No family history of colon cancer.  No significant lower GI symptoms or alarm symptoms.  Requesting colonoscopy records were reviewed to determine timing of her next procedure.  Plan:  1.  CBC, CMP, INR, AFP, ANA, ANA, ASMA, immunoglobulins, iron panel and ferritin, hepatitis A antibody total, hepatitis B surface antibody, hepatitis B core antibody total.  2.  Recommend EGD to screen for portal hypertension/esiohageal varices. Hold off on scheduling until we have reviewed last [facility] records as well.   3.  Request colonoscopy records from [facility] to determine timing  of next colonoscopy.   4.  Recommend 1-2# weight loss per week until ideal body weight through exercise & diet. Low fat/cholesterol diet.   Avoid sweets, sodas, fruit juices, sweetened beverages like tea, etc. Gradually increase exercise from 15 min daily up to 1 hr per day 5 days/week.  5. 2g sodium diet.   6.  Advised to monitor for swelling in her lower extremities, swelling in her abdomen, yellowing of her eyes or skin, changes in mental status/confusion, easy bruising, bright red blood per rectum, or black stools and let us know if this occurs.  7. Next RUQ [place] in September 2022.  8.  Will likely follow-up after procedures.      Aliene Altes, PA-C [facility] 02/06/2021

## 2021-02-06 ENCOUNTER — Encounter: Payer: Self-pay | Admitting: Gastroenterology

## 2021-02-06 ENCOUNTER — Other Ambulatory Visit: Payer: Self-pay

## 2021-02-06 ENCOUNTER — Ambulatory Visit (INDEPENDENT_AMBULATORY_CARE_PROVIDER_SITE_OTHER): Payer: 59 | Admitting: Gastroenterology

## 2021-02-06 VITALS — BP 139/89 | HR 100 | Temp 96.8°F | Ht 60.0 in | Wt 152.2 lb

## 2021-02-06 DIAGNOSIS — Z86010 Personal history of colonic polyps: Secondary | ICD-10-CM | POA: Diagnosis not present

## 2021-02-06 DIAGNOSIS — R7989 Other specified abnormal findings of blood chemistry: Secondary | ICD-10-CM

## 2021-02-06 DIAGNOSIS — K7469 Other cirrhosis of liver: Secondary | ICD-10-CM | POA: Insufficient documentation

## 2021-02-06 DIAGNOSIS — K7460 Unspecified cirrhosis of liver: Secondary | ICD-10-CM | POA: Diagnosis not present

## 2021-02-06 NOTE — Patient Instructions (Addendum)
Please have labs completed at [facility].  We will call you with results.  Recommend 1-2# weight loss per week until ideal body weight through exercise & diet. Low fat/cholesterol diet.   Avoid sweets, sodas, fruit juices, sweetened beverages like tea, etc. Gradually increase exercise from 15 min daily up to 1 hr per day 5 days/week.  Recommend a low sodium diet. No more than 2000 mg per day. This includes everything you eat and drink.   Monitor for swelling in her lower extremities, swelling in her abdomen, yellowing of her eyes or skin, changes in mental status/confusion, easy bruising, bright red blood per rectum, or black stools and let us know if this occurs.  We are requesting a colonoscopy records from [facility].  Once I have received and reviewed these records, will reach out to you to let you know when your next colonoscopy is doing.  We will also schedule an upper endoscopy at that time.   Do not hesitate to reach back out to our office in a couple of weeks if he has not heard anything about scheduling a colonoscopy or upper endoscopy.   It was a pleasure to meet you today!   Ermalinda Memos, PA-C [facility]    Low-Sodium Eating Plan Sodium, which is an element that makes up salt, helps you maintain a healthy balance of fluids in your body. Too much sodium can increase your blood pressure and cause fluid and waste to be held in your body. Your health care provider or dietitian may recommend following this plan if you have high blood pressure (hypertension), kidney disease, liver disease, or heart failure. Eating less sodium can help lower your blood pressure, reduce swelling, and protect your heart, liver, and kidneys. What are tips for following this plan? Reading food labels  The Nutrition Facts label lists the amount of sodium in one serving of the food. If you eat more than one serving, you must multiply the listed amount of sodium by the number of  servings.  Choose foods with less than 140 mg of sodium per serving.  Avoid foods with 300 mg of sodium or more per serving. Shopping  Look for lower-sodium products, often labeled as "low-sodium" or "no salt added."  Always check the sodium content, even if foods are labeled as "unsalted" or "no salt added."  Buy fresh foods. ? Avoid canned foods and pre-made or frozen meals. ? Avoid canned, cured, or processed meats.  Buy breads that have less than 80 mg of sodium per slice.   Cooking  Eat more home-cooked food and less restaurant, buffet, and fast food.  Avoid adding salt when cooking. Use salt-free seasonings or herbs instead of table salt or sea salt. Check with your health care provider or pharmacist before using salt substitutes.  Cook with plant-based oils, such as canola, sunflower, or olive oil.   Meal planning  When eating at a restaurant, ask that your food be prepared with less salt or no salt, if possible. Avoid dishes labeled as brined, pickled, cured, smoked, or made with soy sauce, miso, or teriyaki sauce.  Avoid foods that contain MSG (monosodium glutamate). MSG is sometimes added to [place] food, bouillon, and some canned foods.  Make meals that can be grilled, baked, poached, roasted, or steamed. These are generally made with less sodium. General information Most people on this plan should limit their sodium intake to 1,500-2,000 mg (milligrams) of sodium each day. What foods should I eat? Fruits Fresh, frozen, or canned  fruit. Fruit juice. Vegetables Fresh or frozen vegetables. "No salt added" canned vegetables. "No salt added" tomato sauce and paste. Low-sodium or reduced-sodium tomato and vegetable juice. Grains Low-sodium cereals, including oats, puffed wheat and rice, and shredded wheat. Low-sodium crackers. Unsalted rice. Unsalted pasta. Low-sodium bread. Whole-grain breads and whole-grain pasta. Meats and other proteins Fresh or frozen (no salt  added) meat, poultry, seafood, and fish. Low-sodium canned tuna and salmon. Unsalted nuts. Dried peas, beans, and lentils without added salt. Unsalted canned beans. Eggs. Unsalted nut butters. Dairy Milk. Soy milk. Cheese that is naturally low in sodium, such as ricotta cheese, fresh mozzarella, or Swiss cheese. Low-sodium or reduced-sodium cheese. Cream cheese. Yogurt. Seasonings and condiments Fresh and dried herbs and spices. Salt-free seasonings. Low-sodium mustard and ketchup. Sodium-free salad dressing. Sodium-free light mayonnaise. Fresh or refrigerated horseradish. Lemon juice. Vinegar. Other foods Homemade, reduced-sodium, or low-sodium soups. Unsalted popcorn and pretzels. Low-salt or salt-free chips. The items listed above may not be a complete list of foods and beverages you can eat. Contact a dietitian for more information. What foods should I avoid? Vegetables Sauerkraut, pickled vegetables, and relishes. Olives. [place] fries. Onion rings. Regular canned vegetables (not low-sodium or reduced-sodium). Regular canned tomato sauce and paste (not low-sodium or reduced-sodium). Regular tomato and vegetable juice (not low-sodium or reduced-sodium). Frozen vegetables in sauces. Grains Instant hot cereals. Bread stuffing, pancake, and biscuit mixes. Croutons. Seasoned rice or pasta mixes. Noodle soup cups. Boxed or frozen macaroni and cheese. Regular salted crackers. Self-rising flour. Meats and other proteins Meat or fish that is salted, canned, smoked, spiced, or pickled. Precooked or cured meat, such as sausages or meat loaves. Tomasa Blase. Ham. Pepperoni. Hot dogs. Corned beef. Chipped beef. Salt pork. Jerky. Pickled herring. Anchovies and sardines. Regular canned tuna. Salted nuts. Dairy Processed cheese and cheese spreads. Hard cheeses. Cheese curds. Blue cheese. Feta cheese. String cheese. Regular cottage cheese. Buttermilk. Canned milk. Fats and oils Salted butter. Regular margarine. Ghee.  Bacon fat. Seasonings and condiments Onion salt, garlic salt, seasoned salt, table salt, and sea salt. Canned and packaged gravies. Worcestershire sauce. Tartar sauce. Barbecue sauce. Teriyaki sauce. Soy sauce, including reduced-sodium. Steak sauce. Fish sauce. Oyster sauce. Cocktail sauce. Horseradish that you find on the shelf. Regular ketchup and mustard. Meat flavorings and tenderizers. Bouillon cubes. Hot sauce. Pre-made or packaged marinades. Pre-made or packaged taco seasonings. Relishes. Regular salad dressings. Salsa. Other foods Salted popcorn and pretzels. Corn chips and puffs. Potato and tortilla chips. Canned or dried soups. Pizza. Frozen entrees and pot pies. The items listed above may not be a complete list of foods and beverages you should avoid. Contact a dietitian for more information. Summary  Eating less sodium can help lower your blood pressure, reduce swelling, and protect your heart, liver, and kidneys.  Most people on this plan should limit their sodium intake to 1,500-2,000 mg (milligrams) of sodium each day.  Canned, boxed, and frozen foods are high in sodium. Restaurant foods, fast foods, and pizza are also very high in sodium. You also get sodium by adding salt to food.  Try to cook at home, eat more fresh fruits and vegetables, and eat less fast food and canned, processed, or prepared foods. This information is not intended to replace advice given to you by your health care provider. Make sure you discuss any questions you have with your health care provider. Document Revised: 10/28/2019 Document Reviewed: 08/24/2019 Elsevier Patient Education  2021 [employer].

## 2021-02-07 LAB — CMP14+EGFR
BUN/Creatinine Ratio: 21 (ref 9–23)
Chloride: 96 mmol/L (ref 96–106)
Potassium: 4.5 mmol/L (ref 3.5–5.2)

## 2021-02-07 LAB — CBC WITH DIFFERENTIAL/PLATELET: Basos: 1 %

## 2021-02-09 LAB — MITOCHONDRIAL ANTIBODIES: Mitochondrial Ab: <20 Units (ref 0.0–20.0)

## 2021-02-09 LAB — CBC WITH DIFFERENTIAL/PLATELET
Hemoglobin: 14.2 g/dL (ref 11.1–15.9)
Lymphocytes Absolute: 1.9 10*3/uL (ref 0.7–3.1)
Neutrophils: 60 %
RBC: 4.88 x10E6/uL (ref 3.77–5.28)

## 2021-02-09 LAB — ANTI-SMOOTH MUSCLE ANTIBODY, IGG: Smooth Muscle Ab: 4 Units (ref 0–19)

## 2021-02-09 LAB — HEPATITIS A ANTIBODY, TOTAL

## 2021-02-09 LAB — CMP14+EGFR
ALT: 37 IU/L — ABNORMAL HIGH (ref 0–32)
Alkaline Phosphatase: 56 IU/L (ref 44–121)

## 2021-02-09 LAB — IGG, IGA, IGM: IgA/Immunoglobulin A, Serum: 111 mg/dL (ref 87–352)

## 2021-02-12 LAB — PROTIME-INR
INR: 1 (ref 0.9–1.2)
Prothrombin Time: 10 s (ref 9.1–12.0)

## 2021-02-12 LAB — CMP14+EGFR
AST: 30 IU/L (ref 0–40)
Albumin/Globulin Ratio: 2 (ref 1.2–2.2)
Albumin: 4.7 g/dL (ref 3.8–4.9)
BUN: 12 mg/dL (ref 6–24)
Bilirubin Total: 0.3 mg/dL (ref 0.0–1.2)
CO2: 19 mmol/L — ABNORMAL LOW (ref 20–29)
Calcium: 9.2 mg/dL (ref 8.7–10.2)
Creatinine, Ser: 0.56 mg/dL — ABNORMAL LOW (ref 0.57–1.00)
Globulin, Total: 2.3 g/dL (ref 1.5–4.5)
Glucose: 164 mg/dL — ABNORMAL HIGH (ref 65–99)
Sodium: 139 mmol/L (ref 134–144)
Total Protein: 7 g/dL (ref 6.0–8.5)
eGFR: 106 mL/min/{1.73_m2} (ref >59)

## 2021-02-12 LAB — ANA: ANA Titer 1: NEGATIVE

## 2021-02-12 LAB — CBC WITH DIFFERENTIAL/PLATELET
Basophils Absolute: 0.1 10*3/uL (ref 0.0–0.2)
EOS (ABSOLUTE): 0.2 10*3/uL (ref 0.0–0.4)
Eos: 3 %
Hematocrit: 43.3 % (ref 34.0–46.6)
Immature Grans (Abs): 0 10*3/uL (ref 0.0–0.1)
Immature Granulocytes: 0 %
Lymphs: 28 %
MCH: 29.1 pg (ref 26.6–33.0)
MCHC: 32.8 g/dL (ref 31.5–35.7)
MCV: 89 fL (ref 79–97)
Monocytes Absolute: 0.5 10*3/uL (ref 0.1–0.9)
Monocytes: 8 %
Neutrophils Absolute: 4.2 10*3/uL (ref 1.4–7.0)
Platelets: 306 10*3/uL (ref 150–450)
RDW: 13.7 % (ref 11.7–15.4)
WBC: 6.8 10*3/uL (ref 3.4–10.8)

## 2021-02-12 LAB — IRON,TIBC AND FERRITIN PANEL
Ferritin: 145 ng/mL (ref 15–150)
Iron Saturation: 18 % (ref 15–55)
Iron: 66 ug/dL (ref 27–159)
Total Iron Binding Capacity: 361 ug/dL (ref 250–450)
UIBC: 295 ug/dL (ref 131–425)

## 2021-02-12 LAB — IGG, IGA, IGM
IgG (Immunoglobin G), Serum: 850 mg/dL (ref 586–1602)
IgM (Immunoglobulin M), Srm: 41 mg/dL (ref 26–217)

## 2021-02-12 LAB — HEPATITIS B SURFACE ANTIBODY,QUALITATIVE: Hep B Surface Ab, Qual: NONREACTIVE

## 2021-02-12 LAB — HEPATITIS B CORE ANTIBODY, TOTAL: Hep B Core Total Ab: NEGATIVE

## 2021-02-12 LAB — AFP TUMOR MARKER: AFP, Serum, Tumor Marker: 4.6 ng/mL (ref 0.0–9.2)

## 2021-02-24 ENCOUNTER — Telehealth: Payer: Self-pay | Admitting: Gastroenterology

## 2021-02-24 DIAGNOSIS — K7460 Unspecified cirrhosis of liver: Secondary | ICD-10-CM

## 2021-02-24 NOTE — Telephone Encounter (Signed)
Received and reviewed patient's colonoscopy report from [facility] dated 09/07/2013  Procedure was completed by Dr. Vida Rigger. Indication: Screening for colorectal malignant neoplasm. Impression: External and internal hemorrhoids.  A few benign-appearing diminutive polyps in the rectum, in the sigmoid colon, in the transverse colon, and in the ascending colon biopsy.  The examination was otherwise normal.   Recommendations: Repeat colonoscopy in 5-10 years for surveillance based on pathology results.  Pathology: 1.  Surgical, ascending, transverse, polyp -Polypoid fragments of benign colonic mucosa with and without lymphoid aggregate.  No dysplasia or malignancy identified. 2.  Surgical, sigmoid, rectum, polyps -Polypoid fragments of benign colonic mucosa with lymphoid aggregates.  Polypoid fragments of mildly hyperplastic colonic mucosa.  No dysplasia or malignancy.    [facility] Nurse:  1.  Please let patient know we have received her colonoscopy report from [facility].  She is due for repeat colonoscopy in December 2024. 2.  Recommend we proceed with EGD to screen for esophageal varices (enlarged blood vessels in her esophagus) due to early cirrhosis.  We discussed this at her office visit.  Please let me know if she has any questions. 3.  We will plan to follow-up in the office after her procedure.  [facility] Clinical Pool:  Please arrange EGD with propofol with Dr. Jena Gauss. Dx: Early cirrhosis, esophageal variceal screening. ASA II Hold metformin morning of procedure.

## 2021-02-25 NOTE — Telephone Encounter (Signed)
Phoned and spoke with the pt and advised of the above and she is in agreement to proceed with the EGD (she can be reached at [phone]).  Susan/ Stacey please NIC for colonoscopy in Dec. 2024.

## 2021-02-25 NOTE — Telephone Encounter (Signed)
Will call pt to schedule procedure when Dr. Rourk's next schedule is available. °

## 2021-02-27 NOTE — Telephone Encounter (Signed)
noted 

## 2021-03-18 NOTE — Telephone Encounter (Signed)
On recall  °

## 2021-03-25 NOTE — Addendum Note (Signed)
Addended by: Armstead Peaks on: 03/25/2021 02:51 PM   Modules accepted: Orders

## 2021-03-25 NOTE — Telephone Encounter (Signed)
Called pt and spoke with spouse. EGD with propofol, ASA 2 scheduled for 8/29 at 11:00am. Aware will mail instructions. Confirmed address.

## 2021-05-06 DIAGNOSIS — B9681 Helicobacter pylori [H. pylori] as the cause of diseases classified elsewhere: Secondary | ICD-10-CM

## 2021-05-06 DIAGNOSIS — K2970 Gastritis, unspecified, without bleeding: Secondary | ICD-10-CM

## 2021-05-06 HISTORY — DX: Helicobacter pylori (H. pylori) as the cause of diseases classified elsewhere: B96.81

## 2021-05-06 HISTORY — DX: Gastritis, unspecified, without bleeding: K29.70

## 2021-05-31 ENCOUNTER — Other Ambulatory Visit (HOSPITAL_COMMUNITY)
Admission: RE | Admit: 2021-05-31 | Discharge: 2021-05-31 | Disposition: A | Discharge status: Home / Self Care | Payer: 59 | Source: Ambulatory Visit | Attending: Gastroenterology | Admitting: Gastroenterology

## 2021-05-31 ENCOUNTER — Other Ambulatory Visit: Payer: Self-pay

## 2021-05-31 DIAGNOSIS — K7460 Unspecified cirrhosis of liver: Secondary | ICD-10-CM | POA: Diagnosis present

## 2021-05-31 LAB — BASIC METABOLIC PANEL
Anion gap: 9 (ref 5–15)
BUN: 17 mg/dL (ref 6–20)
CO2: 28 mmol/L (ref 22–32)
Calcium: 8.9 mg/dL (ref 8.9–10.3)
Chloride: 99 mmol/L (ref 98–111)
Creatinine, Ser: 0.53 mg/dL (ref 0.44–1.00)
GFR, Estimated: >60 mL/min (ref >60)
Glucose, Bld: 177 mg/dL — ABNORMAL HIGH (ref 70–99)
Potassium: 4.1 mmol/L (ref 3.5–5.1)
Sodium: 136 mmol/L (ref 135–145)

## 2021-06-03 ENCOUNTER — Ambulatory Visit (HOSPITAL_COMMUNITY)
Admission: RE | Admit: 2021-06-03 | Discharge: 2021-06-03 | Disposition: A | Discharge status: Home / Self Care | Payer: 59 | Attending: Internal Medicine | Admitting: Internal Medicine

## 2021-06-03 ENCOUNTER — Other Ambulatory Visit: Payer: Self-pay

## 2021-06-03 ENCOUNTER — Ambulatory Visit (HOSPITAL_COMMUNITY): Payer: 59 | Admitting: Anesthesiology

## 2021-06-03 ENCOUNTER — Encounter (HOSPITAL_COMMUNITY)
Admission: RE | Disposition: A | Discharge status: Home / Self Care | Payer: Self-pay | Source: Home / Self Care | Attending: Internal Medicine

## 2021-06-03 ENCOUNTER — Encounter (HOSPITAL_COMMUNITY): Payer: Self-pay | Admitting: Internal Medicine

## 2021-06-03 DIAGNOSIS — B9681 Helicobacter pylori [H. pylori] as the cause of diseases classified elsewhere: Secondary | ICD-10-CM | POA: Diagnosis not present

## 2021-06-03 DIAGNOSIS — Z7984 Long term (current) use of oral hypoglycemic drugs: Secondary | ICD-10-CM | POA: Diagnosis not present

## 2021-06-03 DIAGNOSIS — K7460 Unspecified cirrhosis of liver: Secondary | ICD-10-CM | POA: Insufficient documentation

## 2021-06-03 DIAGNOSIS — Z79899 Other long term (current) drug therapy: Secondary | ICD-10-CM | POA: Insufficient documentation

## 2021-06-03 DIAGNOSIS — K2970 Gastritis, unspecified, without bleeding: Secondary | ICD-10-CM | POA: Diagnosis not present

## 2021-06-03 DIAGNOSIS — Z7982 Long term (current) use of aspirin: Secondary | ICD-10-CM | POA: Insufficient documentation

## 2021-06-03 DIAGNOSIS — K766 Portal hypertension: Secondary | ICD-10-CM | POA: Insufficient documentation

## 2021-06-03 DIAGNOSIS — K3189 Other diseases of stomach and duodenum: Secondary | ICD-10-CM | POA: Diagnosis not present

## 2021-06-03 HISTORY — PX: BIOPSY: SHX5522

## 2021-06-03 HISTORY — PX: ESOPHAGOGASTRODUODENOSCOPY (EGD) WITH PROPOFOL: SHX5813

## 2021-06-03 LAB — GLUCOSE, CAPILLARY: Glucose-Capillary: 158 mg/dL — ABNORMAL HIGH (ref 70–99)

## 2021-06-03 SURGERY — ESOPHAGOGASTRODUODENOSCOPY (EGD) WITH PROPOFOL
Anesthesia: General

## 2021-06-03 MED ORDER — PROPOFOL 10 MG/ML IV BOLUS
INTRAVENOUS | Status: DC | PRN
  Administered 2021-06-03: 125 ug/kg/min via INTRAVENOUS
  Administered 2021-06-03: 100 mg via INTRAVENOUS

## 2021-06-03 MED ORDER — LIDOCAINE HCL (CARDIAC) PF 100 MG/5ML IV SOSY
PREFILLED_SYRINGE | INTRAVENOUS | Status: DC | PRN
  Administered 2021-06-03: 100 mg via INTRAVENOUS

## 2021-06-03 MED ORDER — LACTATED RINGERS IV SOLN: INTRAVENOUS | Status: DC

## 2021-06-03 MED ORDER — STERILE WATER FOR IRRIGATION IR SOLN
Status: DC | PRN
  Administered 2021-06-03: 100 mL

## 2021-06-03 NOTE — Transfer of Care (Signed)
Immediate Anesthesia Transfer of Care Note  Patient: Caroline Solis  Procedure(s) Performed: ESOPHAGOGASTRODUODENOSCOPY (EGD) WITH PROPOFOL BIOPSY  Patient Location: Endoscopy Unit  Anesthesia Type:General  Level of Consciousness: awake, alert , oriented and patient cooperative  Airway & Oxygen Therapy: Patient Spontanous Breathing  Post-op Assessment: Report given to RN, Post -op Vital signs reviewed and stable and Patient moving all extremities X 4  Post vital signs: Reviewed and stable  Last Vitals:  Vitals Value Taken Time  BP    Temp    Pulse    Resp    SpO2      Last Pain:  Vitals:   06/03/21 1058  TempSrc:   PainSc: 0-No pain         Complications: No notable events documented.

## 2021-06-03 NOTE — Anesthesia Preprocedure Evaluation (Addendum)
Anesthesia Evaluation  Patient identified by MRN, date of birth, ID band Patient awake    Reviewed: Allergy & Precautions, NPO status , Patient's Chart, lab work & pertinent test results  Airway Mallampati: II  TM Distance: >3 FB Neck ROM: Full    Dental  (+) Dental Advisory Given, Chipped   Pulmonary neg pulmonary ROS,    Pulmonary exam normal breath sounds clear to auscultation       Cardiovascular Exercise Tolerance: Good hypertension, Pt. on medications Normal cardiovascular exam Rhythm:Regular Rate:Normal     Neuro/Psych negative neurological ROS  negative psych ROS   GI/Hepatic negative GI ROS, Neg liver ROS,   Endo/Other  diabetes, Well Controlled, Type 2, Oral Hypoglycemic Agents  Renal/GU      Musculoskeletal negative musculoskeletal ROS (+)   Abdominal   Peds  Hematology negative hematology ROS (+)   Anesthesia Other Findings   Reproductive/Obstetrics negative OB ROS                            Anesthesia Physical Anesthesia Plan  ASA: 2  Anesthesia Plan: General   Post-op Pain Management:    Induction: Intravenous  PONV Risk Score and Plan: TIVA  Airway Management Planned: Nasal Cannula and Natural Airway  Additional Equipment:   Intra-op Plan:   Post-operative Plan:   Informed Consent: I have reviewed the patients History and Physical, chart, labs and discussed the procedure including the risks, benefits and alternatives for the proposed anesthesia with the patient or authorized representative who has indicated his/her understanding and acceptance.     Dental advisory given  Plan Discussed with: CRNA and Surgeon  Anesthesia Plan Comments:         Anesthesia Quick Evaluation

## 2021-06-03 NOTE — Op Note (Signed)
[facility] Patient Name: Caroline Solis Procedure Date: 06/03/2021 10:40 AM MRN: [id number] Date of Birth: 05-Sep-1961 Attending MD: Gennette Pac , MD CSN: [id number] Age: 60 Admit Type: Outpatient Procedure:                Upper GI endoscopy Indications:              Cirrhosis with suspected esophageal varices Providers:                Gennette Pac, MD, Sheran Fava,                            Pandora Leiter, Technician Referring MD:              Medicines:                Propofol per Anesthesia Complications:            No immediate complications. Estimated Blood Loss:     Estimated blood loss was minimal. Procedure:                Pre-Anesthesia Assessment:                           - Prior to the procedure, a History and Physical                            was performed, and patient medications and                            allergies were reviewed. The patient's tolerance of                            previous anesthesia was also reviewed. The risks                            and benefits of the procedure and the sedation                            options and risks were discussed with the patient.                            All questions were answered, and informed consent                            was obtained. Prior Anticoagulants: The patient has                            taken no previous anticoagulant or antiplatelet                            agents. ASA Grade Assessment: II - A patient with                            mild systemic disease. After reviewing the risks  and benefits, the patient was deemed in                            satisfactory condition to undergo the procedure.                           After obtaining informed consent, the endoscope was                            passed under direct vision. Throughout the                            procedure, the patient's blood pressure, pulse, and                             oxygen saturations were monitored continuously. The                            [id number]-[id number] ([id number] scope was introduced through the                            mouth, and advanced to the second part of duodenum.                            The upper GI endoscopy was accomplished without                            difficulty. The patient tolerated the procedure                            well. Scope In: 11:01:57 AM Scope Out: 11:06:55 AM Total Procedure Duration: 0 hours 4 minutes 58 seconds  Findings:      The examined esophagus was normal.      Moderate portal hypertensive gastropathy was found in the entire       examined stomach. Patient had notable, multiple antral erosions. No       ulcer or an infiltrating process observed.      The duodenal bulb and second portion of the duodenum were normal.       Biopsies of the abnormal antral mucosa taken for histologic study Impression:               - Normal esophagus.                           - Portal hypertensive gastropathy. Gastric                            erosions?"status post biopsy                           - Normal duodenal bulb and second portion of the                            duodenum. Moderate Sedation:      Moderate (conscious) sedation was personally administered by an  anesthesia professional. The following parameters were monitored: oxygen       saturation, heart rate, blood pressure, respiratory rate, EKG, adequacy       of pulmonary ventilation, and response to care. Recommendation:           - Patient has a contact number available for                            emergencies. The signs and symptoms of potential                            delayed complications were discussed with the                            patient. Return to normal activities tomorrow.                            Written discharge instructions were provided to the                            patient.                           - Advance  diet as tolerated.                           - Continue present medications.                           - Return to my office in 3 months. Follow-up on                            pathology. Procedure Code(s):        --- Professional ---                           [phone], Esophagogastroduodenoscopy, flexible,                            transoral; diagnostic, including collection of                            specimen(s) by brushing or washing, when performed                            (separate procedure) Diagnosis Code(s):        --- Professional ---                           K76.6, Portal hypertension                           K31.89, Other diseases of stomach and duodenum                           K74.60, Unspecified cirrhosis of liver CPT copyright 2019 American Medical Association. All rights reserved. The codes documented in this report are  preliminary and upon coder review may  be revised to meet current compliance requirements. Gerrit Friends. Mathan Darroch, MD Gennette Pac, MD 06/03/2021 11:22:36 AM This report has been signed electronically. Number of Addenda: 0

## 2021-06-03 NOTE — Anesthesia Postprocedure Evaluation (Signed)
Anesthesia Post Note  Patient: Caroline Solis  Procedure(s) Performed: ESOPHAGOGASTRODUODENOSCOPY (EGD) WITH PROPOFOL BIOPSY  Patient location during evaluation: Endoscopy Anesthesia Type: General Level of consciousness: awake and alert and oriented Pain management: pain level controlled Vital Signs Assessment: post-procedure vital signs reviewed and stable Respiratory status: spontaneous breathing and respiratory function stable Cardiovascular status: blood pressure returned to baseline and stable Postop Assessment: no apparent nausea or vomiting Anesthetic complications: no   No notable events documented.   Last Vitals:  Vitals:   06/03/21 1110 06/03/21 1115  BP: (!) 84/38 104/63  Pulse: 79 82  Resp: (!) 21   Temp: 36.6 C   SpO2: 95% 95%    Last Pain:  Vitals:   06/03/21 1115  TempSrc:   PainSc: 0-No pain                 Morell Mears C Kiley Solimine

## 2021-06-03 NOTE — Discharge Instructions (Signed)
EGD Discharge instructions Please read the instructions outlined below and refer to this sheet in the next few weeks. These discharge instructions provide you with general information on caring for yourself after you leave the hospital. Your doctor may also give you specific instructions. While your treatment has been planned according to the most current medical practices available, unavoidable complications occasionally occur. If you have any problems or questions after discharge, please call your doctor. ACTIVITY You may resume your regular activity but move at a slower pace for the next 24 hours.  Take frequent rest periods for the next 24 hours.  Walking will help expel (get rid of) the air and reduce the bloated feeling in your abdomen.  No driving for 24 hours (because of the anesthesia (medicine) used during the test).  You may shower.  Do not sign any important legal documents or operate any machinery for 24 hours (because of the anesthesia used during the test).  NUTRITION Drink plenty of fluids.  You may resume your normal diet.  Begin with a light meal and progress to your normal diet.  Avoid alcoholic beverages for 24 hours or as instructed by your caregiver.  MEDICATIONS You may resume your normal medications unless your caregiver tells you otherwise.  WHAT YOU CAN EXPECT TODAY You may experience abdominal discomfort such as a feeling of fullness or "gas" pains.  FOLLOW-UP Your doctor will discuss the results of your test with you.  SEEK IMMEDIATE MEDICAL ATTENTION IF ANY OF THE FOLLOWING OCCUR: Excessive nausea (feeling sick to your stomach) and/or vomiting.  Severe abdominal pain and distention (swelling).  Trouble swallowing.  Temperature over 101 F (37.8 C).  Rectal bleeding or vomiting of blood.    You had no signs of varicose veins in your esophagus which is good news  You did have some changes in your stomach to go with some liver disease and some irritation  I  took samples of your stomach lining  We will schedule an office visit with [place] in 3 months  Further recommendations when I get sample results back for review  At your request, I called Fayrene Fearing at [phone] and reviewed findings

## 2021-06-03 NOTE — H&P (Signed)
@LOGO @   Primary Care Physician:  , MD Primary Gastroenterologist:  Dr. Elfredia Nevins  Pre-Procedure History & Physical: HPI:  Caroline Solis is a 60 y.o. female here for screening EGD to assess for esophageal varices/portal hypertension.  Patient has no upper GI tract symptoms.  Specifically denies reflux symptoms nausea vomiting or dysphagia.  Past Medical History:  Diagnosis Date   Diabetes mellitus without complication (HCC)    HLD (hyperlipidemia)    Hypertension     Past Surgical History:  Procedure Laterality Date   c-section X 2     COLONOSCOPY     [facility]   right ankle surgery  08/2020   screws and plate in place    Prior to Admission medications   Medication Sig Start Date End Date Taking? Authorizing Provider  aspirin EC 81 MG tablet Take 81 mg by mouth daily. Swallow whole.   Yes [provider]  atorvastatin (LIPITOR) 20 MG tablet Take 10 mg by mouth daily.   Yes [provider]  BIOTIN 5000 PO Take 2 capsules by mouth daily.   Yes [provider]  calcium carbonate (OS-CAL) 600 MG TABS tablet Take 1,200 mg by mouth daily.   Yes [provider]  Cholecalciferol (VITAMIN D3) 125 MCG (5000 UT) TABS Take 5,000 Units by mouth daily.   Yes [provider]  Coenzyme Q10 (CO Q 10) 100 MG CAPS Take 200 mg by mouth daily.   Yes [provider]  enalapril (VASOTEC) 20 MG tablet Take 20 mg by mouth daily. 12/19/20  Yes [provider]  hydrochlorothiazide (HYDRODIURIL) 25 MG tablet Take 25 mg by mouth daily. 12/19/20  Yes [provider]  ibuprofen (ADVIL,MOTRIN) 200 MG tablet Take 400-800 mg by mouth every 6 (six) hours as needed for headache or moderate pain.   Yes [provider]  metFORMIN (GLUCOPHAGE-XR) 500 MG 24 hr tablet Take 1,000 mg by mouth 2 (two) times daily. 10/04/15  Yes [provider]  Omega-3 Fatty Acids (FISH OIL) 1000 MG CAPS Take 3 capsules by mouth daily.   Yes  [provider]  vitamin k 100 MCG tablet Take 100 mcg by mouth daily.   Yes [provider]  Zinc 50 MG TABS Take 50 mg by mouth daily.   Yes [provider]    Allergies as of 03/25/2021   (No Known Allergies)    Family History  Problem Relation Age of Onset   Cirrhosis Father        secondary to alcohol   Colon cancer Neg Hx     Social History   Socioeconomic History   Marital status: Married    Spouse name: Not on file   Number of children: Not on file   Years of education: Not on file   Highest education level: Not on file  Occupational History   Not on file  Tobacco Use   Smoking status: Never   Smokeless tobacco: Never  Vaping Use   Vaping Use: Never used  Substance and Sexual Activity   Alcohol use: No   Drug use: No   Sexual activity: Not on file  Other Topics Concern   Not on file  Social History Narrative   Not on file   Social Determinants of Health   Financial Resource Strain: Not on file  Food Insecurity: Not on file  Transportation Needs: Not on file  Physical Activity: Not on file  Stress: Not on file  Social Connections: Not  on file  Intimate Partner Violence: Not on file    Review of Systems: See HPI, otherwise negative ROS  Physical Exam: BP (!) 132/59   Pulse 86   Temp 98.3 F (36.8 C) (Oral)   Resp 20   Ht 4\' 11"  (1.499 m)   Wt 66.7 kg   LMP 03/31/2016   SpO2 98%   BMI 29.69 kg/m  General:   Alert,  Well-developed, well-nourished, pleasant and cooperative in NAD Neck:  Supple; no masses or thyromegaly. No significant cervical adenopathy. Lungs:  Clear throughout to auscultation.   No wheezes, crackles, or rhonchi. No acute distress. Heart:  Regular rate and rhythm; no murmurs, clicks, rubs,  or gallops. Abdomen: Non-distended, normal bowel sounds.  Soft and nontender without appreciable mass or hepatosplenomegaly.  Pulses:  Normal pulses noted. Extremities:  Without clubbing or  edema.  Impression/Plan: 60 year old lady with possible of portal hypertension secondary to liver disease.  Here for screening EGD per plan. The risks, benefits, limitations, alternatives and imponderables have been reviewed with the patient. Potential for esophageal dilation, biopsy, etc. have also been reviewed.  Questions have been answered. All parties agreeable.     Notice: This dictation was prepared with Dragon dictation along with smaller phrase technology. Any transcriptional errors that result from this process are unintentional and may not be corrected upon review.

## 2021-06-04 LAB — SURGICAL PATHOLOGY

## 2021-06-05 ENCOUNTER — Encounter: Payer: Self-pay | Admitting: Internal Medicine

## 2021-06-05 ENCOUNTER — Other Ambulatory Visit: Payer: Self-pay | Admitting: Internal Medicine

## 2021-06-05 MED ORDER — AMOXICILL-CLARITHRO-LANSOPRAZ PO MISC: Freq: Two times a day (BID) | ORAL | 0 refills | Status: DC

## 2021-06-06 ENCOUNTER — Telehealth: Payer: Self-pay | Admitting: Internal Medicine

## 2021-06-06 NOTE — Telephone Encounter (Signed)
Pt called stating that she is taking a supplement called Liver Support and is wanting to know if that will interfere with the prevpac that was called in for her yesterday. Please advise.

## 2021-06-06 NOTE — Telephone Encounter (Signed)
[phone] please call patient about her meds. She is taking a supplement and wants to know if that will interfere with her antibiotic  I also told her to check with her pharmacist

## 2021-06-11 ENCOUNTER — Encounter (HOSPITAL_COMMUNITY): Payer: Self-pay | Admitting: Internal Medicine

## 2021-06-17 NOTE — Telephone Encounter (Signed)
Pt decided not to take supplement while she was on the prevpac to be on the safe side.

## 2021-10-19 ENCOUNTER — Encounter: Payer: Self-pay | Admitting: Gastroenterology

## 2021-10-19 NOTE — Progress Notes (Signed)
Referring Provider: Redmond School, MD Primary Care Physician:  Redmond School, MD Primary GI Physician: Dr. Gala Romney  Chief Complaint  Patient presents with   Cirrhosis    F/u. Doing fine    HPI:   Caroline Solis is a 61 y.o. female with history of elevated LFTs, early cirrhosis, and colon polyps, presenting today for follow-up s/p EGD.  She was last seen in our office in May 2022 at the time of initial consult.  Routine cirrhosis labs were ordered along with labs to screen for hemochromatosis, autoimmune conditions, hepatitis A and B immunity.  She is also scheduled for EGD for variceal screening.  We requested colonoscopy records from [facility].  Labs were completed: No immunity to hepatitis A or B.  Autoimmune labs unremarkable.  Iron panel within normal limits.  Slight elevation of ALT at 37. MELD 6.  Colonoscopy was completed in 2014, due for repeat in 2024.  EGD 06/03/2021 with normal esophagus, portal hypertensive gastropathy, gastric erosions s/p biopsy, normal examined duodenum.  Pathology was positive for H. pylori gastritis.  She was treated with Prevpac.  Today: States she is doing well.  No concerns for me today.  Denies abdominal distention, lower extremity edema, yellowing of the eyes or skin, bruising/bleeding, changes in mental status or confusion.  Completed Hep A and B vaccine series in December.   Regarding her history of H. pylori, she did complete her course of antibiotics.  She is not currently on any antacid medications and has not been on antibiotics in the last 2 weeks.  Denies any history of reflux, abdominal pain, nausea, vomiting, dysphagia, constipation, diarrhea, BRBPR, or melena.  Past Medical History:  Diagnosis Date   Cirrhosis ([place])    likely secondary to fatty liver. Completed Hep A and B vaccination in 2022.   Diabetes mellitus without complication ([place])    Helicobacter pylori gastritis 05/2021   August 2022 (via EGD) s/p treatment with  Prevpac   HLD (hyperlipidemia)    Hypertension     Past Surgical History:  Procedure Laterality Date   BIOPSY  06/03/2021   Procedure: BIOPSY;  Surgeon: Daneil Dolin, MD;  Location: [facility];  Service: Endoscopy;;   c-section X 2     COLONOSCOPY  2014   [facility];  External and internal hemorrhoids.  A few diminutive polyps in the rectum, in the sigmoid colon, in the transverse colon, and in the ascending colon biopsy. Pathology was benign. Repeat in 10 years.   ESOPHAGOGASTRODUODENOSCOPY (EGD) WITH PROPOFOL N/A 06/03/2021   Surgeon: Daneil Dolin, MD;  normal esophagus, portal hypertensive gastropathy, gastric erosions s/p biopsy, normal examined duodenum.  Pathology was positive for H. pylori gastritis.   right ankle surgery  08/2020   screws and plate in place    Current Outpatient Medications  Medication Sig Dispense Refill   aspirin EC 81 MG tablet Take 81 mg by mouth daily. Swallow whole.     atorvastatin (LIPITOR) 20 MG tablet Take 10 mg by mouth daily.     BIOTIN 5000 PO Take 2 capsules by mouth daily.     calcium carbonate (OS-CAL) 600 MG TABS tablet Take 1,200 mg by mouth daily.     Cholecalciferol (VITAMIN D3) 125 MCG (5000 UT) TABS Take 5,000 Units by mouth daily.     Coenzyme Q10 (CO Q 10) 100 MG CAPS Take 200 mg by mouth daily.     enalapril (VASOTEC) 20 MG tablet Take 20 mg by mouth  daily.     hydrochlorothiazide (HYDRODIURIL) 25 MG tablet Take 25 mg by mouth daily.     ibuprofen (ADVIL,MOTRIN) 200 MG tablet Take 400-800 mg by mouth every 6 (six) hours as needed for headache or moderate pain.     metFORMIN (GLUCOPHAGE-XR) 500 MG 24 hr tablet Take 1,000 mg by mouth 2 (two) times daily.  0   Omega-3 Fatty Acids (FISH OIL) 1000 MG CAPS Take 3 capsules by mouth daily.     OVER THE COUNTER MEDICATION Natures craft liver support 2 capsules once daily     vitamin k 100 MCG tablet Take 100 mcg by mouth daily.     Zinc 50 MG TABS Take 50 mg by mouth daily.     No  current facility-administered medications for this visit.    Allergies as of 10/21/2021   (No Known Allergies)    Family History  Problem Relation Age of Onset   Cirrhosis Father        secondary to alcohol   Colon cancer Neg Hx     Social History   Socioeconomic History   Marital status: Married    Spouse name: Not on file   Number of children: Not on file   Years of education: Not on file   Highest education level: Not on file  Occupational History   Not on file  Tobacco Use   Smoking status: Never   Smokeless tobacco: Never  Vaping Use   Vaping Use: Never used  Substance and Sexual Activity   Alcohol use: No   Drug use: No   Sexual activity: Not on file  Other Topics Concern   Not on file  Social History Narrative   Not on file   Social Determinants of Health   Financial Resource Strain: Not on file  Food Insecurity: Not on file  Transportation Needs: Not on file  Physical Activity: Not on file  Stress: Not on file  Social Connections: Not on file    Review of Systems: Gen: Denies fever, chills, cold or flulike symptoms, presyncope, syncope. CV: Denies chest pain, palpitations Resp: Denies dyspnea or cough. GI: See HPI Heme: See HPI.  Physical Exam: BP 138/75    Pulse 73    Temp (!) 97.1 F (36.2 C)    Ht 5' (1.524 m)    Wt 150 lb (68 kg)    LMP 03/31/2016    BMI 29.29 kg/m  General:   Alert and oriented. No distress noted. Pleasant and cooperative.  Head:  Normocephalic and atraumatic. Eyes:  Conjuctiva clear without scleral icterus. Heart:  S1, S2 present without murmurs appreciated. Lungs:  Clear to auscultation bilaterally. No wheezes, rales, or rhonchi. No distress.  Abdomen:  +BS, soft, non-tender and non-distended. No rebound or guarding. No HSM or masses noted. Msk:  Symmetrical without gross deformities. Normal posture. Extremities:  Without edema. Neurologic:  Alert and  oriented x4 Psych: Normal mood and affect.     Assessment: 61 year old female with history of type 2 diabetes, HTN, HLD, cirrhosis, and recently found to have H. pylori gastritis in August 2022 at the time of EGD, presenting today for follow-up.  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  No alcohol use.  Prior evaluation for hepatitis A, B, C negative s/p hep A/B vaccines.  Autoimmune labs unremarkable and iron panel also within normal limits.  Clinically she is doing well and remains well compensated.  EGD up-to-date in August 2022 with normal esophagus, portal  hypertensive gastropathy.MELD 6 in May 2022.  Currently due for routine labs and ultrasound for [facility] screening.    History of H. pylori gastritis: Diagnosed at the time of EGD in August 2022.  She completed Prevpac.  We will arrange H. pylori breath test to verify eradication.  She has not been on antibiotics or antacid medications in the last 2 weeks.   Plan:  CBC, CMP, INR, AFP, H. pylori breath test. RUQ ultrasound. Nutrition recommendations: High-protein diet from a primarily plant-based diet. Avoid red meat.  No raw or undercooked meat, seafood, or shellfish. Low-fat/cholesterol/carbohydrate diet. Limit sodium to no more than 2000 mg/day including everything that you eat and drink. Recommend at least 30 minutes of aerobic and resistance exercise 3 days/week. No more than 2000 mg of Tylenol per day. Advised to monitor for lower extremity edema, abdominal distention, jaundice, mental status changes, bright red blood per rectum, or melena and let us know if this occurs. Follow-up in 6 months or sooner if needed.   Aliene Altes, PA-C [facility] 10/21/2021

## 2021-10-21 ENCOUNTER — Other Ambulatory Visit: Payer: Self-pay

## 2021-10-21 ENCOUNTER — Encounter: Payer: Self-pay | Admitting: Gastroenterology

## 2021-10-21 ENCOUNTER — Ambulatory Visit (INDEPENDENT_AMBULATORY_CARE_PROVIDER_SITE_OTHER): Payer: 59 | Admitting: Gastroenterology

## 2021-10-21 VITALS — BP 138/75 | HR 73 | Temp 97.1°F | Ht 60.0 in | Wt 150.0 lb

## 2021-10-21 DIAGNOSIS — K7469 Other cirrhosis of liver: Secondary | ICD-10-CM | POA: Diagnosis not present

## 2021-10-21 DIAGNOSIS — Z8619 Personal history of other infectious and parasitic diseases: Secondary | ICD-10-CM | POA: Insufficient documentation

## 2021-10-21 NOTE — Patient Instructions (Signed)
Please have blood work completed at [facility].  You continue to do well in regards to cirrhosis. I suspect your cirrhosis is secondary to fatty liver.   Monitor for swelling in your abdomen or lower extremities, yellowing of the eyes or skin, changes in mental status/confusion, bright red blood per rectum, or black stools and let us know if this occurs.  Nutrition:  High-protein diet from a primarily plant-based diet. Avoid red meat.  No raw or undercooked meat, seafood, or shellfish. Low-fat/cholesterol/carbohydrate diet. Limit sodium to no more than 2000 mg/day including everything that you eat and drink. Recommend at least 30 minutes of aerobic and resistance exercise 3 days/week.  If you need to take Tylenol for any reason, this is okay.  Do not exceed 2000 mg/day.  We will plan to see you back in 6 months.  Do not hesitate to call if you have any questions or concerns prior to your next visit.   It was a pleasure to see you again today!  Happy new year!    Ermalinda Memos, PA-C [facility]

## 2021-10-23 ENCOUNTER — Encounter: Payer: Self-pay | Admitting: Gastroenterology

## 2021-10-23 LAB — COMPLETE METABOLIC PANEL WITH GFR
AG Ratio: 1.8 (calc) (ref 1.0–2.5)
ALT: 25 U/L (ref 6–29)
AST: 19 U/L (ref 10–35)
Albumin: 4.4 g/dL (ref 3.6–5.1)
Alkaline phosphatase (APISO): 48 U/L (ref 37–153)
BUN: 12 mg/dL (ref 7–25)
CO2: 25 mmol/L (ref 20–32)
Calcium: 9.4 mg/dL (ref 8.6–10.4)
Chloride: 105 mmol/L (ref 98–110)
Creat: 0.5 mg/dL (ref 0.50–1.03)
Globulin: 2.5 g/dL (calc) (ref 1.9–3.7)
Glucose, Bld: 107 mg/dL — ABNORMAL HIGH (ref 65–99)
Potassium: 4.1 mmol/L (ref 3.5–5.3)
Sodium: 140 mmol/L (ref 135–146)
Total Bilirubin: 0.5 mg/dL (ref 0.2–1.2)
Total Protein: 6.9 g/dL (ref 6.1–8.1)
eGFR: 108 mL/min/{1.73_m2} (ref >=60)

## 2021-10-23 LAB — CBC WITH DIFFERENTIAL/PLATELET
Absolute Monocytes: 378 cells/uL (ref 200–950)
Basophils Absolute: 49 cells/uL (ref 0–200)
Basophils Relative: 0.7 %
Eosinophils Absolute: 196 cells/uL (ref 15–500)
Eosinophils Relative: 2.8 %
HCT: 40.3 % (ref 35.0–45.0)
Hemoglobin: 13.5 g/dL (ref 11.7–15.5)
Lymphs Abs: 2499 cells/uL (ref 850–3900)
MCH: 29 pg (ref 27.0–33.0)
MCHC: 33.5 g/dL (ref 32.0–36.0)
MCV: 86.5 fL (ref 80.0–100.0)
MPV: 10.6 fL (ref 7.5–12.5)
Monocytes Relative: 5.4 %
Neutro Abs: 3878 cells/uL (ref 1500–7800)
Neutrophils Relative %: 55.4 %
Platelets: 261 10*3/uL (ref 140–400)
RBC: 4.66 10*6/uL (ref 3.80–5.10)
RDW: 13.2 % (ref 11.0–15.0)
Total Lymphocyte: 35.7 %
WBC: 7 10*3/uL (ref 3.8–10.8)

## 2021-10-23 LAB — AFP TUMOR MARKER: AFP-Tumor Marker: 4.9 ng/mL

## 2021-10-23 LAB — H. PYLORI BREATH TEST: H. pylori Breath Test: DETECTED — AB

## 2021-10-23 LAB — PROTIME-INR
INR: 1
Prothrombin Time: 10 s (ref 9.0–11.5)

## 2021-10-24 ENCOUNTER — Other Ambulatory Visit: Payer: Self-pay | Admitting: Gastroenterology

## 2021-10-24 DIAGNOSIS — A048 Other specified bacterial intestinal infections: Secondary | ICD-10-CM | POA: Insufficient documentation

## 2021-10-24 MED ORDER — TETRACYCLINE HCL 500 MG PO CAPS: 500.0000 mg | ORAL_CAPSULE | Freq: Four times a day (QID) | ORAL | 0 refills | Status: AC

## 2021-10-24 MED ORDER — BISMUTH SUBSALICYLATE 262 MG PO TABS: 524.0000 mg | ORAL_TABLET | Freq: Four times a day (QID) | ORAL | 0 refills | Status: AC

## 2021-10-24 MED ORDER — PANTOPRAZOLE SODIUM 40 MG PO TBEC: 40.0000 mg | DELAYED_RELEASE_TABLET | Freq: Two times a day (BID) | ORAL | 0 refills | Status: DC

## 2021-10-24 MED ORDER — METRONIDAZOLE 500 MG PO TABS: 500.0000 mg | ORAL_TABLET | Freq: Four times a day (QID) | ORAL | 0 refills | Status: AC

## 2021-11-12 ENCOUNTER — Telehealth: Payer: Self-pay | Admitting: *Deleted

## 2021-11-12 NOTE — Telephone Encounter (Signed)
Called pt. Given appt for [place]. She voiced understanding

## 2021-11-22 ENCOUNTER — Other Ambulatory Visit: Payer: Self-pay

## 2021-11-22 ENCOUNTER — Ambulatory Visit (HOSPITAL_COMMUNITY)
Admission: RE | Admit: 2021-11-22 | Discharge: 2021-11-22 | Disposition: A | Discharge status: Home / Self Care | Payer: 59 | Source: Ambulatory Visit | Attending: Gastroenterology | Admitting: Gastroenterology

## 2021-11-22 DIAGNOSIS — K7469 Other cirrhosis of liver: Secondary | ICD-10-CM | POA: Diagnosis present

## 2021-11-25 ENCOUNTER — Telehealth: Payer: Self-pay | Admitting: *Deleted

## 2021-11-25 ENCOUNTER — Other Ambulatory Visit: Payer: Self-pay

## 2021-11-25 ENCOUNTER — Other Ambulatory Visit: Payer: Self-pay | Admitting: *Deleted

## 2021-11-25 DIAGNOSIS — K838 Other specified diseases of biliary tract: Secondary | ICD-10-CM

## 2021-11-25 NOTE — Telephone Encounter (Signed)
Pt called and would like to speak to provider concerning gallbladder and liver issues. Informed her that I would leave a message for provider to call her. [phone].

## 2021-11-26 NOTE — Telephone Encounter (Signed)
Spoke with patient. Explained US findings and the need for MRI. All questions answered. Patient was very appreciative of the call. No further concerns at this time.

## 2021-12-03 LAB — HEPATIC FUNCTION PANEL
ALT: 39 IU/L — ABNORMAL HIGH (ref 0–32)
AST: 22 IU/L (ref 0–40)
Albumin: 4.7 g/dL (ref 3.8–4.9)
Alkaline Phosphatase: 61 IU/L (ref 44–121)
Bilirubin Total: 0.5 mg/dL (ref 0.0–1.2)
Bilirubin, Direct: 0.17 mg/dL (ref 0.00–0.40)
Total Protein: 6.8 g/dL (ref 6.0–8.5)

## 2021-12-10 ENCOUNTER — Other Ambulatory Visit: Payer: Self-pay | Admitting: Gastroenterology

## 2021-12-10 ENCOUNTER — Other Ambulatory Visit: Payer: Self-pay

## 2021-12-10 ENCOUNTER — Ambulatory Visit (HOSPITAL_COMMUNITY)
Admission: RE | Admit: 2021-12-10 | Discharge: 2021-12-10 | Disposition: A | Discharge status: Home / Self Care | Payer: 59 | Source: Ambulatory Visit | Attending: Gastroenterology | Admitting: Gastroenterology

## 2021-12-10 DIAGNOSIS — K838 Other specified diseases of biliary tract: Secondary | ICD-10-CM | POA: Insufficient documentation

## 2021-12-10 MED ORDER — GADOBUTROL 1 MMOL/ML IV SOLN
7.0000 mL | Freq: Once | INTRAVENOUS | Status: AC | PRN
  Administered 2021-12-10: 7 mL via INTRAVENOUS

## 2021-12-13 ENCOUNTER — Other Ambulatory Visit: Payer: Self-pay | Admitting: *Deleted

## 2021-12-13 DIAGNOSIS — R7989 Other specified abnormal findings of blood chemistry: Secondary | ICD-10-CM

## 2021-12-13 DIAGNOSIS — K838 Other specified diseases of biliary tract: Secondary | ICD-10-CM

## 2022-01-02 ENCOUNTER — Other Ambulatory Visit: Payer: Self-pay | Admitting: *Deleted

## 2022-01-02 DIAGNOSIS — K838 Other specified diseases of biliary tract: Secondary | ICD-10-CM

## 2022-01-02 DIAGNOSIS — R7989 Other specified abnormal findings of blood chemistry: Secondary | ICD-10-CM

## 2022-01-11 LAB — HEPATIC FUNCTION PANEL
ALT: 20 IU/L (ref 0–32)
AST: 16 IU/L (ref 0–40)
Albumin: 4.3 g/dL (ref 3.8–4.9)
Alkaline Phosphatase: 62 IU/L (ref 44–121)
Bilirubin Total: 0.3 mg/dL (ref 0.0–1.2)
Bilirubin, Direct: 0.12 mg/dL (ref 0.00–0.40)
Total Protein: 6.7 g/dL (ref 6.0–8.5)

## 2022-02-04 ENCOUNTER — Telehealth: Payer: Self-pay

## 2022-02-04 ENCOUNTER — Telehealth: Payer: Self-pay | Admitting: Internal Medicine

## 2022-02-04 NOTE — Telephone Encounter (Signed)
Opened in error

## 2022-02-04 NOTE — Telephone Encounter (Signed)
RECALL FOR ULTRASOUND 

## 2022-02-04 NOTE — Telephone Encounter (Signed)
Letter mailed

## 2022-02-05 ENCOUNTER — Ambulatory Visit
Admission: RE | Admit: 2022-02-05 | Discharge: 2022-02-05 | Disposition: A | Discharge status: Home / Self Care | Payer: 59 | Source: Ambulatory Visit | Attending: Nurse Practitioner | Admitting: Nurse Practitioner

## 2022-02-05 VITALS — BP 121/75 | HR 80 | Temp 98.0°F | Resp 18

## 2022-02-05 DIAGNOSIS — N3001 Acute cystitis with hematuria: Secondary | ICD-10-CM | POA: Diagnosis not present

## 2022-02-05 LAB — POCT URINALYSIS DIP (MANUAL ENTRY)
Bilirubin, UA: NEGATIVE
Glucose, UA: 100 mg/dL — AB
Nitrite, UA: POSITIVE — AB
Protein Ur, POC: 30 mg/dL — AB
Spec Grav, UA: 1.015 (ref 1.010–1.025)
Urobilinogen, UA: 1 E.U./dL
pH, UA: 5 (ref 5.0–8.0)

## 2022-02-05 MED ORDER — NITROFURANTOIN MONOHYD MACRO 100 MG PO CAPS: 100.0000 mg | ORAL_CAPSULE | Freq: Two times a day (BID) | ORAL | 0 refills | Status: DC

## 2022-02-05 NOTE — ED Provider Notes (Signed)
[facility]    CSN: [id number] Arrival date & time: 02/05/22  1738      History   Chief Complaint Chief Complaint  Patient presents with   Urinary Frequency    Entered by patient    HPI Caroline Solis is a 61 y.o. female.   The patient is a 61 year old female who presents for urinary symptoms.  Symptoms started 2 days ago.  Patient complains of pain with urination, frequency, urgency, and hematuria that started today.  She denies fever, chills, abdominal pain, vaginal symptoms, or flank pain.  She states that she has not had a urinary tract infection in quite some time.  She has been taking Azo for her symptoms.   Urinary Frequency Associated symptoms include abdominal pain (suprapubic pressure).   Past Medical History:  Diagnosis Date   Cirrhosis (HCC)    likely secondary to fatty liver. Completed Hep A and B vaccination in 2022.   Diabetes mellitus without complication (HCC)    Helicobacter pylori gastritis 05/2021   August 2022 (via EGD) s/p treatment with Prevpac   HLD (hyperlipidemia)    Hypertension     Patient Active Problem List   Diagnosis Date Noted   H. pylori infection 10/24/2021   History of Helicobacter pylori infection 10/21/2021   History of colonic polyps 02/06/2021   Elevated LFTs 02/06/2021   Other cirrhosis of liver (HCC) 02/06/2021    Past Surgical History:  Procedure Laterality Date   BIOPSY  06/03/2021   Procedure: BIOPSY;  Surgeon: Corbin Ade, MD;  [facility];  Service: Endoscopy;;   c-section X 2     COLONOSCOPY  2014   [facility];  External and internal hemorrhoids.  A few diminutive polyps in the rectum, in the sigmoid colon, in the transverse colon, and in the ascending colon biopsy. Pathology was benign. Repeat in 10 years.   ESOPHAGOGASTRODUODENOSCOPY (EGD) WITH PROPOFOL N/A 06/03/2021   Surgeon: Corbin Ade, MD;  normal esophagus, portal hypertensive gastropathy, gastric erosions s/p biopsy, normal  examined duodenum.  Pathology was positive for H. pylori gastritis.   right ankle surgery  08/2020   screws and plate in place    OB History   No obstetric history on file.      Home Medications    Prior to Admission medications   Medication Sig Start Date End Date Taking? Authorizing Provider  aspirin EC 81 MG tablet Take 81 mg by mouth daily. Swallow whole.    [provider]  atorvastatin (LIPITOR) 20 MG tablet Take 10 mg by mouth daily.    [provider]  BIOTIN 5000 PO Take 2 capsules by mouth daily.    [provider]  calcium carbonate (OS-CAL) 600 MG TABS tablet Take 1,200 mg by mouth daily.    [provider]  Cholecalciferol (VITAMIN D3) 125 MCG (5000 UT) TABS Take 5,000 Units by mouth daily.    [provider]  Coenzyme Q10 (CO Q 10) 100 MG CAPS Take 200 mg by mouth daily.    [provider]  enalapril (VASOTEC) 20 MG tablet Take 20 mg by mouth daily. 12/19/20   [provider]  hydrochlorothiazide (HYDRODIURIL) 25 MG tablet Take 25 mg by mouth daily. 12/19/20   [provider]  ibuprofen (ADVIL,MOTRIN) 200 MG tablet Take 400-800 mg by mouth every 6 (six) hours as needed for headache or moderate pain.    [provider]  metFORMIN (GLUCOPHAGE-XR) 500 MG 24 hr tablet  Take 1,000 mg by mouth 2 (two) times daily. 10/04/15   [provider]  nitrofurantoin, macrocrystal-monohydrate, (MACROBID) 100 MG capsule Take 1 capsule (100 mg total) by mouth 2 (two) times daily. 02/05/22   Leath-Warren, Sadie Haber, NP  Omega-3 Fatty Acids (FISH OIL) 1000 MG CAPS Take 3 capsules by mouth daily.    [provider]  OVER THE COUNTER MEDICATION Natures craft liver support 2 capsules once daily    [provider]  pantoprazole (PROTONIX) 40 MG tablet Take 1 tablet (40 mg total) by mouth 2 (two) times daily for 14 days. 10/24/21 11/07/21  Letta Median, PA-C  vitamin k 100 MCG tablet Take 100  mcg by mouth daily.    [provider]  Zinc 50 MG TABS Take 50 mg by mouth daily.    [provider]    Family History Family History  Problem Relation Age of Onset   Cirrhosis Father        secondary to alcohol   Colon cancer Neg Hx     Social History Social History   Tobacco Use   Smoking status: Never   Smokeless tobacco: Never  Vaping Use   Vaping Use: Never used  Substance Use Topics   Alcohol use: No   Drug use: No     Allergies   Patient has no known allergies.   Review of Systems Review of Systems  Constitutional: Negative.   Respiratory: Negative.    Cardiovascular: Negative.   Gastrointestinal:  Positive for abdominal pain (suprapubic pressure).  Genitourinary:  Positive for dysuria, frequency, hematuria and urgency. Negative for flank pain.  Skin: Negative.   Psychiatric/Behavioral: Negative.      Physical Exam Triage Vital Signs ED Triage Vitals  Enc Vitals Group     BP 02/05/22 1828 121/75     Pulse Rate 02/05/22 1828 80     Resp 02/05/22 1828 18     Temp 02/05/22 1828 98 F (36.7 C)     Temp Source 02/05/22 1828 Oral     SpO2 02/05/22 1828 97 %     Weight --      Height --      Head Circumference --      Peak Flow --      Pain Score 02/05/22 1830 7     Pain Loc --      Pain Edu? --      Excl. in GC? --    No data found.  Updated Vital Signs BP 121/75 (BP Location: Right Arm)   Pulse 80   Temp 98 F (36.7 C) (Oral)   Resp 18   LMP 03/31/2016   SpO2 97%   Visual Acuity Right Eye Distance:   Left Eye Distance:   Bilateral Distance:    Right Eye Near:   Left Eye Near:    Bilateral Near:     Physical Exam Vitals and nursing note reviewed.  Constitutional:      General: She is not in acute distress.    Appearance: Normal appearance.  HENT:     Head: Normocephalic and atraumatic.     Nose: Nose normal.     Mouth/Throat:     Mouth: Mucous membranes are moist.  Eyes:     Extraocular Movements:  Extraocular movements intact.     Conjunctiva/sclera: Conjunctivae normal.     Pupils: Pupils are equal, round, and reactive to light.  Cardiovascular:     Rate and Rhythm: Normal rate and regular rhythm.  Pulmonary:     Effort: Pulmonary effort is normal.     Breath sounds: Normal breath sounds.  Abdominal:     General: Bowel sounds are normal.     Palpations: Abdomen is soft.     Tenderness: There is abdominal tenderness in the suprapubic area. There is no right CVA tenderness or left CVA tenderness.  Musculoskeletal:     Cervical back: Normal range of motion.  Neurological:     General: No focal deficit present.     Mental Status: She is alert and oriented to person, place, and time.  Psychiatric:        Mood and Affect: Mood normal.        Behavior: Behavior normal.     UC Treatments / Results  Labs (all labs ordered are listed, but only abnormal results are displayed) Labs Reviewed  POCT URINALYSIS DIP (MANUAL ENTRY) - Abnormal; Notable for the following components:      Result Value   Color, UA red (*)    Clarity, UA cloudy (*)    Glucose, UA =100 (*)    Ketones, POC UA trace (5) (*)    Blood, UA small (*)    Protein Ur, POC =30 (*)    Nitrite, UA Positive (*)    Leukocytes, UA Small (1+) (*)    All other components within normal limits  URINE CULTURE    EKG   Radiology No results found.  Procedures Procedures (including critical care time)  Medications Ordered in UC Medications - No data to display  Initial Impression / Assessment and Plan / UC Course  I have reviewed the triage vital signs and the nursing notes.  Pertinent labs & imaging results that were available during my care of the patient were reviewed by me and considered in my medical decision making (see chart for details).  The patient is a 61 year old female who presents with urinary symptoms.  Symptoms have been present for the past 2 days.  On exam, the patient does not have any CVA  tenderness.  There is no concern for pyelonephritis at this time.  Her urinalysis does show positive leukocytes and nitrates.  Urine culture was ordered to ensure the patient has been treated with the appropriate antibiotic, as she is being treated with Macrobid today.  Patient advised to continue the Azo as needed.  Patient was given strict return precautions.  Patient advised to follow-up for worsening of symptoms or other concerns. Final Clinical Impressions(s) / UC Diagnoses   Final diagnoses:  Acute cystitis with hematuria     Discharge Instructions      Take medication as prescribed. You should be voiding every 2 hours while symptoms persist. You should be drinking at least 64 ounces of water daily while symptoms persist. If you are sexually active, you should void approximately 15 to 20 minutes after sexual intercourse. May take ibuprofen or Tylenol for pain, fever, or general discomfort. Follow-up if you develop fever, chills, low back pain or other concerns.    ED Prescriptions     Medication Sig Dispense Auth. Provider   nitrofurantoin, macrocrystal-monohydrate, (MACROBID) 100 MG capsule  (Status: Discontinued) Take 1 capsule (100 mg total) by mouth 2 (two) times daily. 10 capsule Leath-Warren, Sadie Haber, NP   nitrofurantoin, macrocrystal-monohydrate, (MACROBID) 100 MG capsule Take 1 capsule (100 mg total) by mouth 2 (two) times daily. 10 capsule Leath-Warren, Sadie Haber, NP      PDMP not reviewed this encounter.   Leath-Warren, Sadie Haber,  NP 02/05/22 2102

## 2022-02-05 NOTE — Discharge Instructions (Addendum)
Take medication as prescribed. ?You should be voiding every 2 hours while symptoms persist. ?You should be drinking at least 64 ounces of water daily while symptoms persist. ?If you are sexually active, you should void approximately 15 to 20 minutes after sexual intercourse. ?May take ibuprofen or Tylenol for pain, fever, or general discomfort. ?Follow-up if you develop fever, chills, low back pain or other concerns. ?

## 2022-02-05 NOTE — ED Triage Notes (Signed)
Burning on urination and urinary frequency that started on Monday.  Has been taking AZO ?

## 2022-02-07 ENCOUNTER — Other Ambulatory Visit: Payer: Self-pay | Admitting: *Deleted

## 2022-02-07 DIAGNOSIS — K838 Other specified diseases of biliary tract: Secondary | ICD-10-CM

## 2022-02-07 LAB — URINE CULTURE: Culture: <10000 — AB

## 2022-02-12 ENCOUNTER — Ambulatory Visit (HOSPITAL_COMMUNITY)
Admission: RE | Admit: 2022-02-12 | Discharge: 2022-02-12 | Disposition: A | Discharge status: Home / Self Care | Payer: 59 | Source: Ambulatory Visit | Attending: Gastroenterology | Admitting: Gastroenterology

## 2022-02-12 DIAGNOSIS — K838 Other specified diseases of biliary tract: Secondary | ICD-10-CM | POA: Insufficient documentation

## 2022-04-10 ENCOUNTER — Encounter: Payer: Self-pay | Admitting: Internal Medicine

## 2022-05-23 ENCOUNTER — Encounter: Payer: Self-pay | Admitting: *Deleted

## 2022-05-23 ENCOUNTER — Ambulatory Visit (INDEPENDENT_AMBULATORY_CARE_PROVIDER_SITE_OTHER): Payer: 59 | Admitting: Internal Medicine

## 2022-05-23 ENCOUNTER — Encounter: Payer: Self-pay | Admitting: Internal Medicine

## 2022-05-23 ENCOUNTER — Telehealth: Payer: Self-pay | Admitting: *Deleted

## 2022-05-23 VITALS — BP 120/72 | HR 57 | Temp 97.3°F | Ht 59.0 in | Wt 149.2 lb

## 2022-05-23 DIAGNOSIS — A048 Other specified bacterial intestinal infections: Secondary | ICD-10-CM

## 2022-05-23 DIAGNOSIS — K760 Fatty (change of) liver, not elsewhere classified: Secondary | ICD-10-CM | POA: Diagnosis not present

## 2022-05-23 DIAGNOSIS — R7989 Other specified abnormal findings of blood chemistry: Secondary | ICD-10-CM

## 2022-05-23 NOTE — Progress Notes (Unsigned)
Primary Care Physician:  Elfredia Nevins, MD Primary Gastroenterologist:  Dr. Jena Gauss  Pre-Procedure History & Physical: HPI:  Caroline Solis is a 61 y.o. female here for diagnosis of NASH; coarse appearing liver with dilated biliary tree suggested on prior transabdominal ultrasound.  MR RI liver/abdomen demonstrated a slightly enlarged liver fatty in appearance but otherwise normal.  Common bile duct 4 mm. MELD remains in the normal range.  Minimal elevation in transaminases.  Extensive work-up for chronic liver disease negative in chronicled in our last office note. She is consistent with portal hypertensive gastropathy on prior EGD no varices.  Biopsies positive for H. pylori.  She failed first round therapy she took salvage regimen; has not had documentation of eradication as of yet.  She had persistence based on positive breath test.  She did take hepatitis a and B vaccine as previously recommended.  No recent antibiotics and no PPI.  Denies GERD.  MELD 6 at last office visit.  Normal INR, platelet count and albumin.  Past Medical History:  Diagnosis Date   Cirrhosis (HCC)    likely secondary to fatty liver. Completed Hep A and B vaccination in 2022.   Diabetes mellitus without complication (HCC)    Helicobacter pylori gastritis 05/2021   August 2022 (via EGD) s/p treatment with Prevpac   HLD (hyperlipidemia)    Hypertension     Past Surgical History:  Procedure Laterality Date   BIOPSY  06/03/2021   Procedure: BIOPSY;  Surgeon: Corbin Ade, MD;  [facility];  Service: Endoscopy;;   c-section X 2     COLONOSCOPY  2014   [facility];  External and internal hemorrhoids.  A few diminutive polyps in the rectum, in the sigmoid colon, in the transverse colon, and in the ascending colon biopsy. Pathology was benign. Repeat in 10 years.   ESOPHAGOGASTRODUODENOSCOPY (EGD) WITH PROPOFOL N/A 06/03/2021   Surgeon: Corbin Ade, MD;  normal esophagus, portal hypertensive  gastropathy, gastric erosions s/p biopsy, normal examined duodenum.  Pathology was positive for H. pylori gastritis.   right ankle surgery  08/2020   screws and plate in place    Prior to Admission medications   Medication Sig Start Date End Date Taking? Authorizing Provider  aspirin EC 81 MG tablet Take 81 mg by mouth daily. Swallow whole.   Yes [provider]  atorvastatin (LIPITOR) 20 MG tablet Take 10 mg by mouth daily.   Yes [provider]  BIOTIN 5000 PO Take 2 capsules by mouth daily.   Yes [provider]  calcium carbonate (OS-CAL) 600 MG TABS tablet Take 1,200 mg by mouth daily.   Yes [provider]  Cholecalciferol (VITAMIN D3) 125 MCG (5000 UT) TABS Take 5,000 Units by mouth daily.   Yes [provider]  Coenzyme Q10 (CO Q 10) 100 MG CAPS Take 200 mg by mouth daily.   Yes [provider]  enalapril (VASOTEC) 20 MG tablet Take 20 mg by mouth daily. 12/19/20  Yes [provider]  hydrochlorothiazide (HYDRODIURIL) 25 MG tablet Take 25 mg by mouth daily. 12/19/20  Yes [provider]  ibuprofen (ADVIL,MOTRIN) 200 MG tablet Take 400-800 mg by mouth every 6 (six) hours as needed for headache or moderate pain.   Yes [provider]  metFORMIN (GLUCOPHAGE-XR) 500 MG 24 hr tablet Take 1,000 mg by mouth 2 (two) times daily. 10/04/15  Yes [provider]  Omega-3 Fatty Acids (FISH OIL) 1000 MG CAPS Take  3 capsules by mouth daily.   Yes [provider]  OVER THE COUNTER MEDICATION Natures craft liver support 2 capsules once daily   Yes [provider]  vitamin k 100 MCG tablet Take 100 mcg by mouth daily.   Yes [provider]  Zinc 50 MG TABS Take 50 mg by mouth daily.   Yes [provider]    Allergies as of 05/23/2022   (No Known Allergies)    Family History  Problem Relation Age of Onset   Cirrhosis Father        secondary to alcohol   Colon cancer Neg Hx      Social History   Socioeconomic History   Marital status: Married    Spouse name: Not on file   Number of children: Not on file   Years of education: Not on file   Highest education level: Not on file  Occupational History   Not on file  Tobacco Use   Smoking status: Never   Smokeless tobacco: Never  Vaping Use   Vaping Use: Never used  Substance and Sexual Activity   Alcohol use: No   Drug use: No   Sexual activity: Yes  Other Topics Concern   Not on file  Social History Narrative   Not on file   Social Determinants of Health   Financial Resource Strain: Not on file  Food Insecurity: Not on file  Transportation Needs: Not on file  Physical Activity: Not on file  Stress: Not on file  Social Connections: Not on file  Intimate Partner Violence: Not on file    Review of Systems: See HPI, otherwise negative ROS  Physical Exam: BP 120/72 (BP Location: Left Arm, Patient Position: Sitting, Cuff Size: Normal)   Pulse (!) 57   Temp (!) 97.3 F (36.3 C) (Temporal)   Ht 4\' 11"  (1.499 m)   Wt 149 lb 3.2 oz (67.7 kg)   LMP 03/31/2016   SpO2 100%   BMI 30.13 kg/m  General:   Alert,  Well-developed, well-nourished, pleasant and cooperative in NAD Central obesity present. Neck:  Supple; no masses or thyromegaly. No significant cervical adenopathy. Lungs:  Clear throughout to auscultation.   No wheezes, crackles, or rhonchi. No acute distress. Heart:  Regular rate and rhythm; no murmurs, clicks, rubs,  or gallops. Abdomen: Rotund.  Positive bowel sounds.  Soft and nontender no obvious mass organomegaly.  No fluid wave. Pulses:  Normal pulses noted. Extremities:  Without clubbing or edema.  Impression/Plan: Pleasant 61 year old lady with fatty liver on MRI, .mildly elevated transaminases.  Dilated biliary tree and coarse echotexture of liver prior transabdominal ultrasound.  However, only hepatomegaly and fatty appearance found on MRI.  Bile duct not dilated.  Extensive  work-up for chronic liver disease as outlined in her prior note.  History of changes consistent with portal hypertensive gastropathy on prior EGD but H. Pylori (HP infection can mimic portal hypertensive gastropathy in appearance endoscopically)  Low meld and normal platelet count, albumin and INR.  Work-up in totality, suggest patient may not have advanced chronic liver disease.  She certainly could have some fibrosis.  H. pylori eradication needs to be documented   Recommendations:  Continue with healthy lifestyle-good blood sugar control.  Strive for an ideal body weight 135 pounds by the end of 2024  Aerobic exercise 3 times weekly  We will go ahead and get a baseline elastography/special ultrasound now  H. pylori stool antigen testing  Plan for colonoscopy 2024  Office visit with repeat LFTs 6 months      Notice: This dictation was prepared with Dragon dictation along with smaller phrase technology. Any transcriptional errors that result from this process are unintentional and may not be corrected upon review.

## 2022-05-23 NOTE — Telephone Encounter (Signed)
Patient informed that [place] is scheduled for 06/03/22 arrive at 9:15 am at [facility], nothing to eat or drink after midnight. Letter also mailed to patient with appt. Date and time

## 2022-05-23 NOTE — Patient Instructions (Signed)
It was good to see you again today!  Continue with healthy lifestyle-good blood sugar control.  Strive for an ideal body weight 135 pounds by the end of 2024  Aerobic exercise 3 times weekly  We will go ahead and get a baseline elastography/special ultrasound now  H. pylori stool antigen testing  Plan for colonoscopy 2024  Office visit with repeat LFTs 6 months

## 2022-05-30 LAB — H. PYLORI ANTIGEN, STOOL: H pylori Ag, Stl: NEGATIVE

## 2022-06-03 ENCOUNTER — Ambulatory Visit (HOSPITAL_COMMUNITY)
Admission: RE | Admit: 2022-06-03 | Discharge: 2022-06-03 | Disposition: A | Discharge status: Home / Self Care | Payer: 59 | Source: Ambulatory Visit | Attending: Internal Medicine | Admitting: Internal Medicine

## 2022-06-03 DIAGNOSIS — K760 Fatty (change of) liver, not elsewhere classified: Secondary | ICD-10-CM | POA: Diagnosis present

## 2022-06-03 DIAGNOSIS — R7989 Other specified abnormal findings of blood chemistry: Secondary | ICD-10-CM | POA: Insufficient documentation

## 2022-06-03 DIAGNOSIS — A048 Other specified bacterial intestinal infections: Secondary | ICD-10-CM | POA: Insufficient documentation

## 2022-07-31 DIAGNOSIS — Z23 Encounter for immunization: Secondary | ICD-10-CM | POA: Diagnosis not present

## 2022-08-27 DIAGNOSIS — I10 Essential (primary) hypertension: Secondary | ICD-10-CM | POA: Diagnosis not present

## 2022-08-27 DIAGNOSIS — E1165 Type 2 diabetes mellitus with hyperglycemia: Secondary | ICD-10-CM | POA: Diagnosis not present

## 2022-08-27 DIAGNOSIS — Z6829 Body mass index (BMI) 29.0-29.9, adult: Secondary | ICD-10-CM | POA: Diagnosis not present

## 2022-08-27 DIAGNOSIS — K7460 Unspecified cirrhosis of liver: Secondary | ICD-10-CM | POA: Diagnosis not present

## 2022-10-01 DIAGNOSIS — E1151 Type 2 diabetes mellitus with diabetic peripheral angiopathy without gangrene: Secondary | ICD-10-CM | POA: Diagnosis not present

## 2022-10-01 DIAGNOSIS — Z833 Family history of diabetes mellitus: Secondary | ICD-10-CM | POA: Diagnosis not present

## 2022-10-01 DIAGNOSIS — I10 Essential (primary) hypertension: Secondary | ICD-10-CM | POA: Diagnosis not present

## 2022-10-01 DIAGNOSIS — Z823 Family history of stroke: Secondary | ICD-10-CM | POA: Diagnosis not present

## 2022-10-01 DIAGNOSIS — Z7984 Long term (current) use of oral hypoglycemic drugs: Secondary | ICD-10-CM | POA: Diagnosis not present

## 2022-10-01 DIAGNOSIS — Z803 Family history of malignant neoplasm of breast: Secondary | ICD-10-CM | POA: Diagnosis not present

## 2022-10-01 DIAGNOSIS — E785 Hyperlipidemia, unspecified: Secondary | ICD-10-CM | POA: Diagnosis not present

## 2022-10-01 DIAGNOSIS — Z7982 Long term (current) use of aspirin: Secondary | ICD-10-CM | POA: Diagnosis not present

## 2022-10-01 DIAGNOSIS — Z8249 Family history of ischemic heart disease and other diseases of the circulatory system: Secondary | ICD-10-CM | POA: Diagnosis not present

## 2022-10-14 ENCOUNTER — Encounter: Payer: Self-pay | Admitting: Internal Medicine

## 2022-12-23 ENCOUNTER — Encounter: Payer: Self-pay | Admitting: *Deleted

## 2022-12-23 ENCOUNTER — Other Ambulatory Visit: Payer: Self-pay | Admitting: *Deleted

## 2022-12-23 ENCOUNTER — Other Ambulatory Visit: Payer: Self-pay

## 2022-12-23 ENCOUNTER — Ambulatory Visit (INDEPENDENT_AMBULATORY_CARE_PROVIDER_SITE_OTHER): Payer: 59 | Admitting: Internal Medicine

## 2022-12-23 ENCOUNTER — Encounter: Payer: Self-pay | Admitting: Internal Medicine

## 2022-12-23 VITALS — BP 125/74 | HR 83 | Temp 98.1°F | Ht 59.0 in | Wt 152.2 lb

## 2022-12-23 DIAGNOSIS — Z1211 Encounter for screening for malignant neoplasm of colon: Secondary | ICD-10-CM

## 2022-12-23 DIAGNOSIS — R7989 Other specified abnormal findings of blood chemistry: Secondary | ICD-10-CM

## 2022-12-23 DIAGNOSIS — K760 Fatty (change of) liver, not elsewhere classified: Secondary | ICD-10-CM

## 2022-12-23 MED ORDER — PEG 3350-KCL-NA BICARB-NACL 420 G PO SOLR: 4000.0000 mL | Freq: Once | ORAL | 0 refills | Status: AC

## 2022-12-23 NOTE — Progress Notes (Unsigned)
Primary Care Physician:  Redmond School, MD Primary Gastroenterologist:  Dr.   Pre-Procedure History & Physical: HPI:  Caroline Solis is a 62 y.o. female here for   Pre-Procedure History & Physical: HPI:  Caroline Solis is a 62 y.o. female here for of NAFLD, steatohepatitis, metabolic liver disease.  Previously felt to have more advanced liver disease based on imaging done.  Most recently LFTs completely normal 1 year ago.  MRI revealed only a fatty liver and a nondilated bile duct.  Extensive workup serologically for other etiologies of potential liver disease came back negative.  She is doing very well she is struggling with losing weight.  She has type 2 diabetes mellitus on metformin. She is 10 years out from her last colonoscopy which was negative; due for average risk screening at this time.  Elastography revealed a K PA of 5.     Past Medical History:  Diagnosis Date   Cirrhosis ([place])    likely secondary to fatty liver. Completed Hep A and B vaccination in 2022.   Diabetes mellitus without complication ([place])    Helicobacter pylori gastritis 05/2021   August 2022 (via EGD) s/p treatment with Prevpac   HLD (hyperlipidemia)    Hypertension     Past Surgical History:  Procedure Laterality Date   BIOPSY  06/03/2021   Procedure: BIOPSY;  Surgeon: Daneil Dolin, MD;  [facility];  Service: Endoscopy;;   c-section X 2     COLONOSCOPY  2014   [facility];  External and internal hemorrhoids.  A few diminutive polyps in the rectum, in the sigmoid colon, in the transverse colon, and in the ascending colon biopsy. Pathology was benign. Repeat in 10 years.   ESOPHAGOGASTRODUODENOSCOPY (EGD) WITH PROPOFOL N/A 06/03/2021   Surgeon: Daneil Dolin, MD;  normal esophagus, portal hypertensive gastropathy, gastric erosions s/p biopsy, normal examined duodenum.  Pathology was positive for H. pylori gastritis.   right ankle surgery  08/2020   screws and plate in place     Prior to Admission medications   Medication Sig Start Date End Date Taking? Authorizing Provider  aspirin EC 81 MG tablet Take 81 mg by mouth daily. Swallow whole.   Yes [provider]  atorvastatin (LIPITOR) 20 MG tablet Take 10 mg by mouth daily.   Yes [provider]  BIOTIN 5000 PO Take 2 capsules by mouth daily.   Yes [provider]  calcium carbonate (OS-CAL) 600 MG TABS tablet Take 1,200 mg by mouth daily.   Yes [provider]  Cholecalciferol (VITAMIN D3) 125 MCG (5000 UT) TABS Take 5,000 Units by mouth daily.   Yes [provider]  Coenzyme Q10 (CO Q 10) 100 MG CAPS Take 200 mg by mouth daily.   Yes [provider]  enalapril (VASOTEC) 20 MG tablet Take 20 mg by mouth daily. 12/19/20  Yes [provider]  hydrochlorothiazide (HYDRODIURIL) 25 MG tablet Take 25 mg by mouth daily. 12/19/20  Yes [provider]  ibuprofen (ADVIL,MOTRIN) 200 MG tablet Take 400-800 mg by mouth every 6 (six) hours as needed for headache or moderate pain.   Yes [provider]  metFORMIN (GLUCOPHAGE-XR) 500 MG 24 hr tablet Take 1,000 mg by mouth 2 (two) times daily. 10/04/15  Yes [provider]  Omega-3 Fatty Acids (FISH OIL) 1000 MG CAPS Take 3 capsules by mouth daily.   Yes [provider]  OVER THE COUNTER MEDICATION Natures craft liver support  2 capsules once daily   Yes [provider]  vitamin k 100 MCG tablet Take 100 mcg by mouth daily.   Yes [provider]  Zinc 50 MG TABS Take 50 mg by mouth daily.   Yes [provider]    Allergies as of 12/23/2022   (No Known Allergies)    Family History  Problem Relation Age of Onset   Cirrhosis Father        secondary to alcohol   Colon cancer Neg Hx     Social History   Socioeconomic History   Marital status: Married    Spouse name: Not on file   Number of children: Not on file   Years of education: Not on file    Highest education level: Not on file  Occupational History   Not on file  Tobacco Use   Smoking status: Never   Smokeless tobacco: Never  Vaping Use   Vaping Use: Never used  Substance and Sexual Activity   Alcohol use: No   Drug use: No   Sexual activity: Yes  Other Topics Concern   Not on file  Social History Narrative   Not on file   Social Determinants of Health   Financial Resource Strain: Not on file  Food Insecurity: Not on file  Transportation Needs: Not on file  Physical Activity: Not on file  Stress: Not on file  Social Connections: Not on file  Intimate Partner Violence: Not on file    Review of Systems: See HPI, otherwise negative ROS  Physical Exam: BP 125/74 (BP Location: Right Arm, Patient Position: Sitting, Cuff Size: Normal)   Pulse 83   Temp 98.1 F (36.7 C) (Oral)   Ht 4\' 11"  (1.499 m)   Wt 152 lb 3.2 oz (69 kg)   LMP 03/31/2016   SpO2 98%   BMI 30.74 kg/m  General:   Alert,  Well-developed, well-nourished, pleasant and cooperative in NAD Skin:  Intact without significant lesions or rashes. Eyes:  Sclera clear, no icterus.   Conjunctiva pink. Ears:  Normal auditory acuity. Nose:  No deformity, discharge,  or lesions. Mouth:  No deformity or lesions. Neck:  Supple; no masses or thyromegaly. No significant cervical adenopathy. Lungs:  Clear throughout to auscultation.   No wheezes, crackles, or rhonchi. No acute distress. Heart:  Regular rate and rhythm; no murmurs, clicks, rubs,  or gallops. Abdomen: Non-distended, normal bowel sounds.  Soft and nontender without appreciable mass or hepatosplenomegaly.  Pulses:  Normal pulses noted. Extremities:  Without clubbing or edema.  Impression/Plan:  ***    It is recommended to continue with aerobic exercise 3 times weekly.  Strive for maximal control your blood sugars and weight loss  1 to 2 cups of coffee daily good for your liver  As discussed, you are due for your 10-year average risk  screening colonoscopy.  ASA 2).  We will get you scheduled in the near future.  Will modify Glucophage treatment per protocol during prep.  Hepatic function profile today  Further recommendations to follow.  Notice: This dictation was prepared with Dragon dictation along with smaller phrase technology. Any transcriptional errors that result from this process are unintentional and may not be corrected upon review.

## 2022-12-23 NOTE — Patient Instructions (Signed)
It was good to see you again today!  Workup today indicates you have fatty liver but no evidence of advanced chronic liver disease which is good news.  It is recommended to continue with aerobic exercise 3 times weekly.  Strive for maximal control your blood sugars and weight loss  1 to 2 cups of coffee daily good for your liver  As discussed, you are due for your 10-year average risk screening colonoscopy.  ASA 2).  We will get you scheduled in the near future.  Will modify Glucophage treatment per protocol during prep.  Hepatic function profile today  Further recommendations to follow.

## 2022-12-23 NOTE — Addendum Note (Signed)
Addended by: Madelin Rear on: 12/23/2022 03:11 PM   Modules accepted: Orders

## 2022-12-24 DIAGNOSIS — R7989 Other specified abnormal findings of blood chemistry: Secondary | ICD-10-CM | POA: Diagnosis not present

## 2022-12-25 LAB — HEPATIC FUNCTION PANEL
ALT: 37 IU/L — ABNORMAL HIGH (ref 0–32)
AST: 34 IU/L (ref 0–40)
Albumin: 4.5 g/dL (ref 3.8–4.9)
Alkaline Phosphatase: 63 IU/L (ref 44–121)
Bilirubin Total: 0.5 mg/dL (ref 0.0–1.2)
Bilirubin, Direct: 0.16 mg/dL (ref 0.00–0.40)
Total Protein: 7.1 g/dL (ref 6.0–8.5)

## 2022-12-26 ENCOUNTER — Other Ambulatory Visit: Payer: Self-pay

## 2022-12-26 DIAGNOSIS — R7989 Other specified abnormal findings of blood chemistry: Secondary | ICD-10-CM

## 2022-12-26 NOTE — Addendum Note (Signed)
Addended by: Orland Jarred on: 12/26/2022 10:36 AM   Modules accepted: Orders

## 2023-01-28 ENCOUNTER — Other Ambulatory Visit (HOSPITAL_COMMUNITY)
Admission: RE | Admit: 2023-01-28 | Discharge: 2023-01-28 | Disposition: A | Discharge status: Home / Self Care | Payer: 59 | Source: Ambulatory Visit | Attending: Internal Medicine | Admitting: Internal Medicine

## 2023-01-28 DIAGNOSIS — Z1211 Encounter for screening for malignant neoplasm of colon: Secondary | ICD-10-CM | POA: Diagnosis not present

## 2023-01-28 DIAGNOSIS — R7989 Other specified abnormal findings of blood chemistry: Secondary | ICD-10-CM | POA: Insufficient documentation

## 2023-01-28 LAB — BASIC METABOLIC PANEL
Anion gap: 15 (ref 5–15)
BUN: 14 mg/dL (ref 6–20)
CO2: 23 mmol/L (ref 22–32)
Calcium: 9.2 mg/dL (ref 8.9–10.3)
Chloride: 97 mmol/L — ABNORMAL LOW (ref 98–111)
Creatinine, Ser: 0.59 mg/dL (ref 0.44–1.00)
GFR, Estimated: >60 mL/min (ref >60)
Glucose, Bld: 168 mg/dL — ABNORMAL HIGH (ref 70–99)
Potassium: 3.3 mmol/L — ABNORMAL LOW (ref 3.5–5.1)
Sodium: 135 mmol/L (ref 135–145)

## 2023-01-28 LAB — HEPATIC FUNCTION PANEL
ALT: 50 U/L — ABNORMAL HIGH (ref 0–44)
AST: 37 U/L (ref 15–41)
Albumin: 4.3 g/dL (ref 3.5–5.0)
Alkaline Phosphatase: 62 U/L (ref 38–126)
Bilirubin, Direct: 0.1 mg/dL (ref 0.0–0.2)
Indirect Bilirubin: 0.6 mg/dL (ref 0.3–0.9)
Total Bilirubin: 0.7 mg/dL (ref 0.3–1.2)
Total Protein: 8.1 g/dL (ref 6.5–8.1)

## 2023-01-29 ENCOUNTER — Encounter (HOSPITAL_COMMUNITY): Payer: Self-pay | Admitting: Internal Medicine

## 2023-01-29 ENCOUNTER — Other Ambulatory Visit: Payer: Self-pay

## 2023-01-29 ENCOUNTER — Encounter (HOSPITAL_COMMUNITY)
Admission: RE | Disposition: A | Discharge status: Home / Self Care | Payer: Self-pay | Source: Ambulatory Visit | Attending: Internal Medicine

## 2023-01-29 ENCOUNTER — Ambulatory Visit (HOSPITAL_COMMUNITY): Payer: 59 | Admitting: Anesthesiology

## 2023-01-29 ENCOUNTER — Ambulatory Visit (HOSPITAL_COMMUNITY)
Admission: RE | Admit: 2023-01-29 | Discharge: 2023-01-29 | Disposition: A | Discharge status: Home / Self Care | Payer: 59 | Source: Ambulatory Visit | Attending: Internal Medicine | Admitting: Internal Medicine

## 2023-01-29 ENCOUNTER — Ambulatory Visit (HOSPITAL_BASED_OUTPATIENT_CLINIC_OR_DEPARTMENT_OTHER): Payer: 59 | Admitting: Anesthesiology

## 2023-01-29 DIAGNOSIS — E119 Type 2 diabetes mellitus without complications: Secondary | ICD-10-CM | POA: Insufficient documentation

## 2023-01-29 DIAGNOSIS — Z800 Family history of malignant neoplasm of digestive organs: Secondary | ICD-10-CM | POA: Insufficient documentation

## 2023-01-29 DIAGNOSIS — E785 Hyperlipidemia, unspecified: Secondary | ICD-10-CM | POA: Diagnosis not present

## 2023-01-29 DIAGNOSIS — K640 First degree hemorrhoids: Secondary | ICD-10-CM

## 2023-01-29 DIAGNOSIS — Z1211 Encounter for screening for malignant neoplasm of colon: Secondary | ICD-10-CM | POA: Diagnosis not present

## 2023-01-29 DIAGNOSIS — Z09 Encounter for follow-up examination after completed treatment for conditions other than malignant neoplasm: Secondary | ICD-10-CM | POA: Diagnosis not present

## 2023-01-29 DIAGNOSIS — Z7984 Long term (current) use of oral hypoglycemic drugs: Secondary | ICD-10-CM | POA: Insufficient documentation

## 2023-01-29 DIAGNOSIS — Z8719 Personal history of other diseases of the digestive system: Secondary | ICD-10-CM | POA: Insufficient documentation

## 2023-01-29 DIAGNOSIS — Z79899 Other long term (current) drug therapy: Secondary | ICD-10-CM | POA: Diagnosis not present

## 2023-01-29 DIAGNOSIS — K766 Portal hypertension: Secondary | ICD-10-CM | POA: Insufficient documentation

## 2023-01-29 DIAGNOSIS — I10 Essential (primary) hypertension: Secondary | ICD-10-CM | POA: Insufficient documentation

## 2023-01-29 DIAGNOSIS — Z8379 Family history of other diseases of the digestive system: Secondary | ICD-10-CM | POA: Diagnosis not present

## 2023-01-29 HISTORY — PX: COLONOSCOPY WITH PROPOFOL: SHX5780

## 2023-01-29 LAB — GLUCOSE, CAPILLARY: Glucose-Capillary: 155 mg/dL — ABNORMAL HIGH (ref 70–99)

## 2023-01-29 SURGERY — COLONOSCOPY WITH PROPOFOL
Anesthesia: General

## 2023-01-29 MED ORDER — LIDOCAINE HCL (CARDIAC) PF 100 MG/5ML IV SOSY
PREFILLED_SYRINGE | INTRAVENOUS | Status: DC | PRN
  Administered 2023-01-29: 50 mg via INTRAVENOUS

## 2023-01-29 MED ORDER — LACTATED RINGERS IV SOLN: INTRAVENOUS | Status: DC

## 2023-01-29 MED ORDER — PROPOFOL 10 MG/ML IV BOLUS
INTRAVENOUS | Status: DC | PRN
  Administered 2023-01-29: 100 mg via INTRAVENOUS

## 2023-01-29 MED ORDER — PROPOFOL 500 MG/50ML IV EMUL
INTRAVENOUS | Status: DC | PRN
  Administered 2023-01-29: 150 ug/kg/min via INTRAVENOUS

## 2023-01-29 NOTE — Anesthesia Procedure Notes (Signed)
Date/Time: 01/29/2023 10:19 AM  Performed by: Julian Reil, CRNAPre-anesthesia Checklist: Patient identified, Emergency Drugs available, Suction available and Patient being monitored Patient Re-evaluated:Patient Re-evaluated prior to induction Oxygen Delivery Method: Nasal cannula Induction Type: IV induction Placement Confirmation: positive ETCO2

## 2023-01-29 NOTE — Anesthesia Postprocedure Evaluation (Signed)
Anesthesia Post Note  Patient: Caroline Solis  Procedure(s) Performed: COLONOSCOPY WITH PROPOFOL  Patient location during evaluation: Phase II Anesthesia Type: General Level of consciousness: awake and alert and oriented Pain management: pain level controlled Vital Signs Assessment: post-procedure vital signs reviewed and stable Respiratory status: spontaneous breathing, nonlabored ventilation and respiratory function stable Cardiovascular status: blood pressure returned to baseline and stable Postop Assessment: no apparent nausea or vomiting Anesthetic complications: no  No notable events documented.   Last Vitals:  Vitals:   01/29/23 0917 01/29/23 1035  BP: (!) 141/81 (!) 97/50  Pulse: (!) 104 91  Resp: (!) 22 (!) 27  Temp: 36.9 C 36.8 C  SpO2: 98% 97%    Last Pain:  Vitals:   01/29/23 1035  TempSrc: Axillary  PainSc: 0-No pain                 Betania Dizon C Meer Reindl

## 2023-01-29 NOTE — Discharge Instructions (Signed)
  Colonoscopy Discharge Instructions  Read the instructions outlined below and refer to this sheet in the next few weeks. These discharge instructions provide you with general information on caring for yourself after you leave the hospital. Your doctor may also give you specific instructions. While your treatment has been planned according to the most current medical practices available, unavoidable complications occasionally occur. If you have any problems or questions after discharge, call Dr. Jena Gauss at [phone]. ACTIVITY You may resume your regular activity, but move at a slower pace for the next 24 hours.  Take frequent rest periods for the next 24 hours.  Walking will help get rid of the air and reduce the bloated feeling in your belly (abdomen).  No driving for 24 hours (because of the medicine (anesthesia) used during the test).   Do not sign any important legal documents or operate any machinery for 24 hours (because of the anesthesia used during the test).  NUTRITION Drink plenty of fluids.  You may resume your normal diet as instructed by your doctor.  Begin with a light meal and progress to your normal diet. Heavy or fried foods are harder to digest and may make you feel sick to your stomach (nauseated).  Avoid alcoholic beverages for 24 hours or as instructed.  MEDICATIONS You may resume your normal medications unless your doctor tells you otherwise.  WHAT YOU CAN EXPECT TODAY Some feelings of bloating in the abdomen.  Passage of more gas than usual.  Spotting of blood in your stool or on the toilet paper.  IF YOU HAD POLYPS REMOVED DURING THE COLONOSCOPY: No aspirin products for 7 days or as instructed.  No alcohol for 7 days or as instructed.  Eat a soft diet for the next 24 hours.  FINDING OUT THE RESULTS OF YOUR TEST Not all test results are available during your visit. If your test results are not back during the visit, make an appointment with your caregiver to find out the  results. Do not assume everything is normal if you have not heard from your caregiver or the medical facility. It is important for you to follow up on all of your test results.  SEEK IMMEDIATE MEDICAL ATTENTION IF: You have more than a spotting of blood in your stool.  Your belly is swollen (abdominal distention).  You are nauseated or vomiting.  You have a temperature over 101.  You have abdominal pain or discomfort that is severe or gets worse throughout the day.     No polyps found today  It is recommended he return in 10 years for repeat colonoscopy  Office visit with [place] in 4 months (follow-up fatty liver)  At patient request, called Letitia Neri at [phone] rolled to voicemail.

## 2023-01-29 NOTE — Op Note (Signed)
[facility] Patient Name: Caroline Solis Procedure Date: 01/29/2023 9:47 AM MRN: [id number] Date of Birth: 17-Oct-1960 Attending MD: Gennette Pac , MD, [id number] CSN: [id number] Age: 62 Admit Type: Outpatient Procedure:                Colonoscopy Indications:              Screening for colorectal malignant neoplasm Providers:                Gennette Pac, MD, Angelica Ran, Dyann Ruddle Referring MD:              Medicines:                Propofol per Anesthesia Complications:            No immediate complications. Estimated Blood Loss:     Estimated blood loss: none. Procedure:                Pre-Anesthesia Assessment:                           - Prior to the procedure, a History and Physical                            was performed, and patient medications and                            allergies were reviewed. The patient's tolerance of                            previous anesthesia was also reviewed. The risks                            and benefits of the procedure and the sedation                            options and risks were discussed with the patient.                            All questions were answered, and informed consent                            was obtained. Prior Anticoagulants: The patient has                            taken no anticoagulant or antiplatelet agents. ASA                            Grade Assessment: II - A patient with mild systemic                            disease. After reviewing the risks and benefits,                            the patient was deemed in satisfactory condition to  undergo the procedure.                           After obtaining informed consent, the colonoscope                            was passed under direct vision. Throughout the                            procedure, the patient's blood pressure, pulse, and                            oxygen saturations were monitored continuously.  The                            [id number] scope was introduced through the                            anus and advanced to the the cecum, identified by                            appendiceal orifice and ileocecal valve. The                            colonoscopy was performed without difficulty. The                            patient tolerated the procedure well. The quality                            of the bowel preparation was adequate. The                            colonoscopy was performed without difficulty. The                            patient tolerated the procedure well. The quality                            of the bowel preparation was adequate. The entire                            colon was well visualized. Scope In: 10:18:14 AM Scope Out: 10:32:14 AM Scope Withdrawal Time: 0 hours 8 minutes 10 seconds  Total Procedure Duration: 0 hours 14 minutes 0 seconds  Findings:      The perianal and digital rectal examinations were normal.      Non-bleeding internal hemorrhoids were found during retroflexion. The       hemorrhoids were moderate, medium-sized and Grade I (internal       hemorrhoids that do not prolapse). Anal papilla also present.      The exam was otherwise without abnormality on direct and retroflexion       views. Impression:               - Non-bleeding internal hemorrhoids /anal papilla.                           -  The examination was otherwise normal on direct                            and retroflexion views.                           - No specimens collected. Moderate Sedation:      Moderate (conscious) sedation was personally administered by an       anesthesia professional. The following parameters were monitored: oxygen       saturation, heart rate, blood pressure, respiratory rate, EKG, adequacy       of pulmonary ventilation, and response to care. Recommendation:           - Patient has a contact number available for                             emergencies. The signs and symptoms of potential                            delayed complications were discussed with the                            patient. Return to normal activities tomorrow.                            Written discharge instructions were provided to the                            patient.                           - Advance diet as tolerated.                           - Continue present medications.                           - Repeat colonoscopy in 10 years for screening                            purposes.                           - Return to GI office in 4 months. Procedure Code(s):        --- Professional ---                           [phone], Colonoscopy, flexible; diagnostic, including                            collection of specimen(s) by brushing or washing,                            when performed (separate procedure) Diagnosis Code(s):        --- Professional ---  Z12.11, Encounter for screening for malignant                            neoplasm of colon                           K64.0, First degree hemorrhoids CPT copyright 2022 American Medical Association. All rights reserved. The codes documented in this report are preliminary and upon coder review may  be revised to meet current compliance requirements. Gerrit Friends. Acey Woodfield, MD Gennette Pac, MD 01/29/2023 10:39:30 AM This report has been signed electronically. Number of Addenda: 0

## 2023-01-29 NOTE — H&P (Signed)
 @   Primary Care Physician:  Elfredia Nevins, MD Primary Gastroenterologist:  Dr. Jena Gauss  Pre-Procedure History & Physical: HPI:  Caroline Solis is a 62 y.o. female is here for a screening colonoscopy.  Last colonoscopy approximately 10 years ago.  No bowel symptoms.  No family history of colon cancer.  Past Medical History:  Diagnosis Date   Cirrhosis    likely secondary to fatty liver. Completed Hep A and B vaccination in 2022.   Diabetes mellitus without complication    Helicobacter pylori gastritis 05/2021   August 2022 (via EGD) s/p treatment with Prevpac   HLD (hyperlipidemia)    Hypertension     Past Surgical History:  Procedure Laterality Date   BIOPSY  06/03/2021   Procedure: BIOPSY;  Surgeon: Corbin Ade, MD;  [facility];  Service: Endoscopy;;   c-section X 2     COLONOSCOPY  2014   [facility];  External and internal hemorrhoids.  A few diminutive polyps in the rectum, in the sigmoid colon, in the transverse colon, and in the ascending colon biopsy. Pathology was benign. Repeat in 10 years.   ESOPHAGOGASTRODUODENOSCOPY (EGD) WITH PROPOFOL N/A 06/03/2021   Surgeon: Corbin Ade, MD;  normal esophagus, portal hypertensive gastropathy, gastric erosions s/p biopsy, normal examined duodenum.  Pathology was positive for H. pylori gastritis.   right ankle surgery  08/2020   screws and plate in place    Prior to Admission medications   Medication Sig Start Date End Date Taking? Authorizing Provider  acetaminophen (TYLENOL) 500 MG tablet Take 1,000 mg by mouth every 6 (six) hours as needed for mild pain or moderate pain.   Yes [provider]  aspirin EC 81 MG tablet Take 81 mg by mouth daily. Swallow whole.   Yes [provider]  atorvastatin (LIPITOR) 20 MG tablet Take 20 mg by mouth daily.   Yes [provider]  BIOTIN 5000 PO Take 2 capsules by mouth daily.   Yes [provider]  Cholecalciferol (VITAMIN D3) 125 MCG  (5000 UT) TABS Take 5,000 Units by mouth daily.   Yes [provider]  Coenzyme Q10 (CO Q 10) 100 MG CAPS Take 200 mg by mouth at bedtime.   Yes [provider]  enalapril (VASOTEC) 20 MG tablet Take 20 mg by mouth daily. 12/19/20  Yes [provider]  hydrochlorothiazide (HYDRODIURIL) 25 MG tablet Take 25 mg by mouth daily. 12/19/20  Yes [provider]  ibuprofen (ADVIL,MOTRIN) 200 MG tablet Take 400-800 mg by mouth every 6 (six) hours as needed for headache or moderate pain.   Yes [provider]  metFORMIN (GLUCOPHAGE-XR) 500 MG 24 hr tablet Take 1,000 mg by mouth 2 (two) times daily. 10/04/15  Yes [provider]  Omega-3 Fatty Acids (FISH OIL) 1000 MG CAPS Take 3,000 mg by mouth daily.   Yes [provider]  OVER THE COUNTER MEDICATION Natures craft liver support 2 capsules once daily   Yes [provider]  vitamin k 100 MCG tablet Take 100 mcg by mouth daily.   Yes [provider]  Zinc 50 MG TABS Take 50 mg by mouth daily.   Yes [provider]    Allergies as of 12/23/2022   (No Known Allergies)    Family History  Problem Relation Age of Onset   Cirrhosis Father        secondary to alcohol   Colon cancer Neg Hx     Social  History   Socioeconomic History   Marital status: Married    Spouse name: Not on file   Number of children: Not on file   Years of education: Not on file   Highest education level: Not on file  Occupational History   Not on file  Tobacco Use   Smoking status: Never   Smokeless tobacco: Never  Vaping Use   Vaping Use: Never used  Substance and Sexual Activity   Alcohol use: No   Drug use: No   Sexual activity: Yes  Other Topics Concern   Not on file  Social History Narrative   Not on file   Social Determinants of Health   Financial Resource Strain: Not on file  Food Insecurity: Not on file  Transportation Needs: Not on file  Physical Activity: Not on  file  Stress: Not on file  Social Connections: Not on file  Intimate Partner Violence: Not on file    Review of Systems: See HPI, otherwise negative ROS  Physical Exam: BP (!) 141/81   Pulse (!) 104   Temp 98.4 F (36.9 C) (Oral)   Resp (!) 22   Ht  (1.499 m)   Wt 68 kg   LMP 03/31/2016   SpO2 98%   BMI 30.30 kg/m  General:   Alert,  Well-developed, well-nourished, pleasant and cooperative in NAD Lungs:  Clear throughout to auscultation.   No wheezes, crackles, or rhonchi. No acute distress. Heart:  Regular rate and rhythm; no murmurs, clicks, rubs,  or gallops. Abdomen:  Soft, nontender and nondistended. No masses, hepatosplenomegaly or hernias noted. Normal bowel sounds, without guarding, and without rebound.    Impression/Plan: Caroline Solis is now here to undergo a screening colonoscopy.  Average risk screening examination.  Risks, benefits, limitations, imponderables and alternatives regarding colonoscopy have been reviewed with the patient. Questions have been answered. All parties agreeable.     Notice:  This dictation was prepared with Dragon dictation along with smaller phrase technology. Any transcriptional errors that result from this process are unintentional and may not be corrected upon review.

## 2023-01-29 NOTE — Transfer of Care (Signed)
Immediate Anesthesia Transfer of Care Note  Patient: Caroline Solis  Procedure(s) Performed: COLONOSCOPY WITH PROPOFOL  Patient Location: Endoscopy Unit  Anesthesia Type:General  Level of Consciousness: drowsy  Airway & Oxygen Therapy: Patient Spontanous Breathing  Post-op Assessment: Report given to RN and Post -op Vital signs reviewed and stable  Post vital signs: Reviewed and stable  Last Vitals:  Vitals Value Taken Time  BP    Temp    Pulse    Resp    SpO2      Last Pain:  Vitals:   01/29/23 1014  TempSrc:   PainSc: 0-No pain      Patients Stated Pain Goal: 5 (01/29/23 0917)  Complications: No notable events documented.

## 2023-01-29 NOTE — Anesthesia Preprocedure Evaluation (Addendum)
Anesthesia Evaluation  Patient identified by MRN, date of birth, ID band Patient awake    Reviewed: Allergy & Precautions, H&P , NPO status , Patient's Chart, lab work & pertinent test results  Airway Mallampati: II  TM Distance: >3 FB Neck ROM: Full    Dental  (+) Dental Advisory Given, Missing   Pulmonary neg pulmonary ROS   Pulmonary exam normal breath sounds clear to auscultation       Cardiovascular Exercise Tolerance: Good hypertension, Pt. on medications Normal cardiovascular exam Rhythm:Regular Rate:Normal     Neuro/Psych negative neurological ROS  negative psych ROS   GI/Hepatic negative GI ROS, Neg liver ROS,,,  Endo/Other  diabetes, Well Controlled, Type 2, Oral Hypoglycemic Agents    Renal/GU negative Renal ROS  negative genitourinary   Musculoskeletal negative musculoskeletal ROS (+)    Abdominal   Peds negative pediatric ROS (+)  Hematology negative hematology ROS (+)   Anesthesia Other Findings   Reproductive/Obstetrics negative OB ROS                             Anesthesia Physical Anesthesia Plan  ASA: 2  Anesthesia Plan: General   Post-op Pain Management: Minimal or no pain anticipated   Induction: Intravenous  PONV Risk Score and Plan: 1 and Propofol infusion  Airway Management Planned: Nasal Cannula and Natural Airway  Additional Equipment:   Intra-op Plan:   Post-operative Plan:   Informed Consent: I have reviewed the patients History and Physical, chart, labs and discussed the procedure including the risks, benefits and alternatives for the proposed anesthesia with the patient or authorized representative who has indicated his/her understanding and acceptance.     Dental advisory given  Plan Discussed with: CRNA and Surgeon  Anesthesia Plan Comments:        Anesthesia Quick Evaluation

## 2023-02-02 ENCOUNTER — Ambulatory Visit
Admission: EM | Admit: 2023-02-02 | Discharge: 2023-02-02 | Disposition: A | Discharge status: Home / Self Care | Payer: 59 | Attending: Physician Assistant | Admitting: Physician Assistant

## 2023-02-02 DIAGNOSIS — N390 Urinary tract infection, site not specified: Secondary | ICD-10-CM | POA: Diagnosis not present

## 2023-02-02 LAB — POCT URINALYSIS DIP (MANUAL ENTRY)
Bilirubin, UA: NEGATIVE
Glucose, UA: NEGATIVE mg/dL
Ketones, POC UA: NEGATIVE mg/dL
Nitrite, UA: POSITIVE — AB
Protein Ur, POC: NEGATIVE mg/dL
Spec Grav, UA: 1.025 (ref 1.010–1.025)
Urobilinogen, UA: 0.2 E.U./dL
pH, UA: 5.5 (ref 5.0–8.0)

## 2023-02-02 MED ORDER — CEPHALEXIN 500 MG PO CAPS: 500.0000 mg | ORAL_CAPSULE | Freq: Four times a day (QID) | ORAL | 0 refills | Status: AC

## 2023-02-02 NOTE — ED Triage Notes (Signed)
Pt reports low back pain, low abdominal pain, burning with urination, some bleeding while wiping, and frequent urination x 4 days.  Pt took azo this morning

## 2023-02-02 NOTE — ED Provider Notes (Signed)
[facility]    CSN: [id number] Arrival date & time: 02/02/23  1712      History   Chief Complaint Chief Complaint  Patient presents with   Urinary Tract Infection    HPI Caroline Solis is a 62 y.o. female.   Patient complains of burning with urination.  Patient reports symptoms began 4 days ago.  Patient has tried AZO without any relief.  Patient reports she has had urinary tract infections in the past  The history is provided by the patient. No language interpreter was used.  Urinary Tract Infection Pain quality:  Aching Pain severity:  Moderate Onset quality:  Gradual Timing:  Constant Progression:  Worsening Chronicity:  New Relieved by:  Nothing Worsened by:  Nothing Ineffective treatments:  None tried   Past Medical History:  Diagnosis Date   Cirrhosis (HCC)    likely secondary to fatty liver. Completed Hep A and B vaccination in 2022.   Diabetes mellitus without complication (HCC)    Helicobacter pylori gastritis 05/2021   August 2022 (via EGD) s/p treatment with Prevpac   HLD (hyperlipidemia)    Hypertension     Patient Active Problem List   Diagnosis Date Noted   H. pylori infection 10/24/2021   History of Helicobacter pylori infection 10/21/2021   History of colonic polyps 02/06/2021   Elevated LFTs 02/06/2021   Other cirrhosis of liver (HCC) 02/06/2021    Past Surgical History:  Procedure Laterality Date   BIOPSY  06/03/2021   Procedure: BIOPSY;  Surgeon: Corbin Ade, MD;  Location[facility];  Service: Endoscopy;;   c-section X 2     COLONOSCOPY  2014   [facility];  External and internal hemorrhoids.  A few diminutive polyps in the rectum, in the sigmoid colon, in the transverse colon, and in the ascending colon biopsy. Pathology was benign. Repeat in 10 years.   ESOPHAGOGASTRODUODENOSCOPY (EGD) WITH PROPOFOL N/A 06/03/2021   Surgeon: Corbin Ade, MD;  normal esophagus, portal hypertensive gastropathy, gastric erosions s/p  biopsy, normal examined duodenum.  Pathology was positive for H. pylori gastritis.   right ankle surgery  08/2020   screws and plate in place    OB History   No obstetric history on file.      Home Medications    Prior to Admission medications   Medication Sig Start Date End Date Taking? Authorizing Provider  cephALEXin (KEFLEX) 500 MG capsule Take 1 capsule (500 mg total) by mouth 4 (four) times daily for 10 days. 02/02/23 02/12/23 Yes Elson Areas, PA-C  acetaminophen (TYLENOL) 500 MG tablet Take 1,000 mg by mouth every 6 (six) hours as needed for mild pain or moderate pain.    [provider]  aspirin EC 81 MG tablet Take 81 mg by mouth daily. Swallow whole.    [provider]  atorvastatin (LIPITOR) 20 MG tablet Take 20 mg by mouth daily.    [provider]  BIOTIN 5000 PO Take 2 capsules by mouth daily.    [provider]  Cholecalciferol (VITAMIN D3) 125 MCG (5000 UT) TABS Take 5,000 Units by mouth daily.    [provider]  Coenzyme Q10 (CO Q 10) 100 MG CAPS Take 200 mg by mouth at bedtime.    [provider]  enalapril (VASOTEC) 20 MG tablet Take 20 mg by mouth daily. 12/19/20   [provider]  hydrochlorothiazide (HYDRODIURIL) 25 MG tablet Take 25 mg by mouth daily. 12/19/20  [provider]  ibuprofen (ADVIL,MOTRIN) 200 MG tablet Take 400-800 mg by mouth every 6 (six) hours as needed for headache or moderate pain.    [provider]  metFORMIN (GLUCOPHAGE-XR) 500 MG 24 hr tablet Take 1,000 mg by mouth 2 (two) times daily. 10/04/15   [provider]  Omega-3 Fatty Acids (FISH OIL) 1000 MG CAPS Take 3,000 mg by mouth daily.    [provider]  OVER THE COUNTER MEDICATION Natures craft liver support 2 capsules once daily    [provider]  vitamin k 100 MCG tablet Take 100 mcg by mouth daily.    [provider]  Zinc 50 MG TABS Take 50 mg by mouth daily.     [provider]    Family History Family History  Problem Relation Age of Onset   Cirrhosis Father        secondary to alcohol   Colon cancer Neg Hx     Social History Social History   Tobacco Use   Smoking status: Never   Smokeless tobacco: Never  Vaping Use   Vaping Use: Never used  Substance Use Topics   Alcohol use: No   Drug use: No     Allergies   Patient has no known allergies.   Review of Systems Review of Systems  All other systems reviewed and are negative.    Physical Exam Triage Vital Signs ED Triage Vitals  Enc Vitals Group     BP 02/02/23 1729 130/70     Pulse Rate 02/02/23 1729 100     Resp 02/02/23 1729 20     Temp 02/02/23 1729 97.9 F (36.6 C)     Temp Source 02/02/23 1729 Oral     SpO2 02/02/23 1729 98 %     Weight --      Height --      Head Circumference --      Peak Flow --      Pain Score 02/02/23 1731 3     Pain Loc --      Pain Edu? --      Excl. in GC? --    No data found.  Updated Vital Signs BP 130/70 (BP Location: Right Arm)   Pulse 100   Temp 97.9 F (36.6 C) (Oral)   Resp 20   LMP 03/31/2016   SpO2 98%   Visual Acuity Right Eye Distance:   Left Eye Distance:   Bilateral Distance:    Right Eye Near:   Left Eye Near:    Bilateral Near:     Physical Exam Vitals and nursing note reviewed.  Constitutional:      Appearance: She is well-developed.  HENT:     Head: Normocephalic.     Nose: Nose normal.  Cardiovascular:     Rate and Rhythm: Normal rate.     Pulses: Normal pulses.  Pulmonary:     Effort: Pulmonary effort is normal.  Abdominal:     General: There is no distension.  Musculoskeletal:        General: Normal range of motion.     Cervical back: Normal range of motion.  Skin:    General: Skin is warm.  Neurological:     General: No focal deficit present.     Mental Status: She is alert and oriented to person, place, and time.  Psychiatric:        Mood and Affect: Mood normal.       UC Treatments /  Results  Labs (all labs ordered are listed, but only abnormal results are displayed) Labs Reviewed  POCT URINALYSIS DIP (MANUAL ENTRY) - Abnormal; Notable for the following components:      Result Value   Color, UA orange (*)    Blood, UA trace-intact (*)    Nitrite, UA Positive (*)    Leukocytes, UA Small (1+) (*)    All other components within normal limits    EKG   Radiology No results found.  Procedures Procedures (including critical care time)  Medications Ordered in UC Medications - No data to display  Initial Impression / Assessment and Plan / UC Course  I have reviewed the triage vital signs and the nursing notes.  Pertinent labs & imaging results that were available during my care of the patient were reviewed by me and considered in my medical decision making (see chart for details).      Final Clinical Impressions(s) / UC Diagnoses   Final diagnoses:  Urinary tract infection without hematuria, site unspecified     Discharge Instructions      Return if any problems.    ED Prescriptions     Medication Sig Dispense Auth. Provider   cephALEXin (KEFLEX) 500 MG capsule Take 1 capsule (500 mg total) by mouth 4 (four) times daily for 10 days. 28 capsule Elson Areas, [place]      PDMP not reviewed this encounter. An After Visit Summary was printed and given to the patient.       Elson Areas, [place] 02/02/23 1806

## 2023-02-02 NOTE — Discharge Instructions (Addendum)
Return if any problems.

## 2023-02-03 ENCOUNTER — Encounter (HOSPITAL_COMMUNITY): Payer: Self-pay | Admitting: Internal Medicine

## 2023-02-03 ENCOUNTER — Telehealth: Payer: Self-pay | Admitting: Emergency Medicine

## 2023-02-03 NOTE — Telephone Encounter (Signed)
Pt presented to UC inquiring about length of abx prescription. Paperwork and prescription states 1 pill PO QID x10 days  but pt only received quantity of 28. Consulted provider and stated quantity of 28 will treat UTI. Pt aware to take medication for 7 days rather than 10. Pt verbalized understanding.

## 2023-02-18 DIAGNOSIS — Z833 Family history of diabetes mellitus: Secondary | ICD-10-CM | POA: Diagnosis not present

## 2023-02-18 DIAGNOSIS — E119 Type 2 diabetes mellitus without complications: Secondary | ICD-10-CM | POA: Diagnosis not present

## 2023-02-18 DIAGNOSIS — Z7982 Long term (current) use of aspirin: Secondary | ICD-10-CM | POA: Diagnosis not present

## 2023-02-18 DIAGNOSIS — I10 Essential (primary) hypertension: Secondary | ICD-10-CM | POA: Diagnosis not present

## 2023-02-18 DIAGNOSIS — E785 Hyperlipidemia, unspecified: Secondary | ICD-10-CM | POA: Diagnosis not present

## 2023-02-18 DIAGNOSIS — Z7984 Long term (current) use of oral hypoglycemic drugs: Secondary | ICD-10-CM | POA: Diagnosis not present

## 2023-02-18 DIAGNOSIS — K760 Fatty (change of) liver, not elsewhere classified: Secondary | ICD-10-CM | POA: Diagnosis not present

## 2023-04-11 ENCOUNTER — Encounter: Payer: Self-pay | Admitting: Emergency Medicine

## 2023-04-11 ENCOUNTER — Ambulatory Visit
Admission: EM | Admit: 2023-04-11 | Discharge: 2023-04-11 | Disposition: A | Discharge status: Home / Self Care | Payer: 59 | Attending: Nurse Practitioner | Admitting: Nurse Practitioner

## 2023-04-11 DIAGNOSIS — R399 Unspecified symptoms and signs involving the genitourinary system: Secondary | ICD-10-CM | POA: Insufficient documentation

## 2023-04-11 LAB — POCT URINALYSIS DIP (MANUAL ENTRY)
Bilirubin, UA: NEGATIVE
Glucose, UA: >=1000 mg/dL — AB
Ketones, POC UA: NEGATIVE mg/dL
Nitrite, UA: NEGATIVE
Spec Grav, UA: 1.025 (ref 1.010–1.025)
Urobilinogen, UA: 0.2 E.U./dL
pH, UA: 5 (ref 5.0–8.0)

## 2023-04-11 MED ORDER — SULFAMETHOXAZOLE-TRIMETHOPRIM 800-160 MG PO TABS: 1.0000 | ORAL_TABLET | Freq: Two times a day (BID) | ORAL | 0 refills | Status: AC

## 2023-04-11 NOTE — ED Provider Notes (Signed)
[facility]    CSN: [id number] Arrival date & time: 04/11/23  0836      History   Chief Complaint No chief complaint on file.   HPI Caroline Solis is a 62 y.o. female.   The history is provided by the patient.   The patient presents for urinary symptoms that been present for the past 3 days.  Patient complains of urinary urgency, frequency, pain with urination, lower abdominal pain, and low back pain.  Patient states this morning when she wiped, she also noticed a "tinge" of blood.  Patient denies fever, chest pain, nausea, vomiting, diarrhea, decreased urine stream, flank pain, vaginal odor, vaginal itching, or vaginal discharge.  Patient reports her last UTI was in April.  She denies prior history of recurrent urinary tract infections.  Past Medical History:  Diagnosis Date   Cirrhosis (HCC)    likely secondary to fatty liver. Completed Hep A and B vaccination in 2022.   Diabetes mellitus without complication (HCC)    Helicobacter pylori gastritis 05/2021   August 2022 (via EGD) s/p treatment with Prevpac   HLD (hyperlipidemia)    Hypertension     Patient Active Problem List   Diagnosis Date Noted   H. pylori infection 10/24/2021   History of Helicobacter pylori infection 10/21/2021   History of colonic polyps 02/06/2021   Elevated LFTs 02/06/2021   Other cirrhosis of liver (HCC) 02/06/2021    Past Surgical History:  Procedure Laterality Date   BIOPSY  06/03/2021   Procedure: BIOPSY;  Surgeon: Corbin Ade, MD;  [facility];  Service: Endoscopy;;   c-section X 2     COLONOSCOPY  2014   [facility];  External and internal hemorrhoids.  A few diminutive polyps in the rectum, in the sigmoid colon, in the transverse colon, and in the ascending colon biopsy. Pathology was benign. Repeat in 10 years.   COLONOSCOPY WITH PROPOFOL N/A 01/29/2023   Procedure: COLONOSCOPY WITH PROPOFOL;  Surgeon: Corbin Ade, MD;  [facility]: [facility];  Service:  Endoscopy;  Laterality: N/A;  10;45 am   ESOPHAGOGASTRODUODENOSCOPY (EGD) WITH PROPOFOL N/A 06/03/2021   Surgeon: Corbin Ade, MD;  normal esophagus, portal hypertensive gastropathy, gastric erosions s/p biopsy, normal examined duodenum.  Pathology was positive for H. pylori gastritis.   right ankle surgery  08/2020   screws and plate in place    OB History   No obstetric history on file.      Home Medications    Prior to Admission medications   Medication Sig Start Date End Date Taking? Authorizing Provider  sulfamethoxazole-trimethoprim (BACTRIM DS) 800-160 MG tablet Take 1 tablet by mouth 2 (two) times daily for 7 days. 04/11/23 04/18/23 Yes Claus Silvestro-Warren, Sadie Haber, NP  acetaminophen (TYLENOL) 500 MG tablet Take 1,000 mg by mouth every 6 (six) hours as needed for mild pain or moderate pain.    [provider]  aspirin EC 81 MG tablet Take 81 mg by mouth daily. Swallow whole.    [provider]  atorvastatin (LIPITOR) 20 MG tablet Take 20 mg by mouth daily.    [provider]  BIOTIN 5000 PO Take 2 capsules by mouth daily.    [provider]  Cholecalciferol (VITAMIN D3) 125 MCG (5000 UT) TABS Take 5,000 Units by mouth daily.    [provider]  Coenzyme Q10 (CO Q 10) 100 MG CAPS Take 200 mg by mouth at bedtime.    [provider]  enalapril (VASOTEC) 20 MG tablet Take 20 mg by mouth daily. 12/19/20   [provider]  hydrochlorothiazide (HYDRODIURIL) 25 MG tablet Take 25 mg by mouth daily. 12/19/20   [provider]  ibuprofen (ADVIL,MOTRIN) 200 MG tablet Take 400-800 mg by mouth every 6 (six) hours as needed for headache or moderate pain.    [provider]  metFORMIN (GLUCOPHAGE-XR) 500 MG 24 hr tablet Take 1,000 mg by mouth 2 (two) times daily. 10/04/15   [provider]  Omega-3 Fatty Acids (FISH OIL) 1000 MG CAPS Take 3,000 mg by mouth daily.    [provider]  OVER THE COUNTER  MEDICATION Natures craft liver support 2 capsules once daily    [provider]  vitamin k 100 MCG tablet Take 100 mcg by mouth daily.    [provider]  Zinc 50 MG TABS Take 50 mg by mouth daily.    [provider]    Family History Family History  Problem Relation Age of Onset   Cirrhosis Father        secondary to alcohol   Colon cancer Neg Hx     Social History Social History   Tobacco Use   Smoking status: Never   Smokeless tobacco: Never  Vaping Use   Vaping Use: Never used  Substance Use Topics   Alcohol use: No   Drug use: No     Allergies   Patient has no known allergies.   Review of Systems Review of Systems Per HPI  Physical Exam Triage Vital Signs ED Triage Vitals  Enc Vitals Group     BP 04/11/23 0857 127/76     Pulse Rate 04/11/23 0857 80     Resp 04/11/23 0857 18     Temp 04/11/23 0857 98.2 F (36.8 C)     Temp Source 04/11/23 0857 Oral     SpO2 04/11/23 0857 98 %     Weight --      Height --      Head Circumference --      Peak Flow --      Pain Score 04/11/23 0858 3     Pain Loc --      Pain Edu? --      Excl. in GC? --    No data found.  Updated Vital Signs BP 127/76 (BP Location: Right Arm)   Pulse 80   Temp 98.2 F (36.8 C) (Oral)   Resp 18   LMP 03/31/2016   SpO2 98%   Visual Acuity Right Eye Distance:   Left Eye Distance:   Bilateral Distance:    Right Eye Near:   Left Eye Near:    Bilateral Near:     Physical Exam Vitals and nursing note reviewed.  Constitutional:      General: She is not in acute distress.    Appearance: Normal appearance. She is well-developed.  HENT:     Head: Normocephalic.     Mouth/Throat:     Mouth: Mucous membranes are moist.  Eyes:     Extraocular Movements: Extraocular movements intact.     Pupils: Pupils are equal, round, and reactive to light.  Cardiovascular:     Rate and Rhythm: Normal rate and regular rhythm.     Pulses: Normal pulses.     Heart  sounds: Normal heart sounds.  Pulmonary:     Effort: Pulmonary effort is normal.     Breath sounds: Normal breath sounds.  Abdominal:  General: Bowel sounds are normal. There is no distension.     Palpations: Abdomen is soft.     Tenderness: There is abdominal tenderness in the suprapubic area. There is no right CVA tenderness, left CVA tenderness, guarding or rebound.  Genitourinary:    Vagina: Normal. No vaginal discharge.  Musculoskeletal:     Cervical back: Normal range of motion.  Skin:    General: Skin is warm and dry.     Findings: No erythema or rash.  Neurological:     General: No focal deficit present.     Mental Status: She is alert and oriented to person, place, and time.     Cranial Nerves: No cranial nerve deficit.  Psychiatric:        Mood and Affect: Mood normal.        Behavior: Behavior normal.      UC Treatments / Results  Labs (all labs ordered are listed, but only abnormal results are displayed) Labs Reviewed  POCT URINALYSIS DIP (MANUAL ENTRY) - Abnormal; Notable for the following components:      Result Value   Clarity, UA cloudy (*)    Glucose, UA >=1,000 (*)    Blood, UA large (*)    Protein Ur, POC trace (*)    Leukocytes, UA Moderate (2+) (*)    All other components within normal limits    EKG   Radiology No results found.  Procedures Procedures (including critical care time)  Medications Ordered in UC Medications - No data to display  Initial Impression / Assessment and Plan / UC Course  I have reviewed the triage vital signs and the nursing notes.  Pertinent labs & imaging results that were available during my care of the patient were reviewed by me and considered in my medical decision making (see chart for details).  The patient is well-appearing, she is in no acute distress, vital signs are stable.  Urinalysis is suggestive of a urinary tract infection, along with patient's reported symptoms, urine culture is pending.   While urine culture is pending, will treat empirically with Bactrim DS 800/160 mg tablets.  Supportive care recommendations were provided and discussed with the patient to include increasing fluids, developing a toileting schedule while symptoms persist, and use of over-the-counter analgesics for pain or discomfort.  Patient was advised if the urine culture is negative and she continues to experience symptoms, it is recommended that she follow-up with her primary care physician for further evaluation.  Patient was also given ER follow-up precautions.  Patient is in agreement with this plan of care and verbalizes understanding.  All questions were answered.  Patient stable for discharge.   Final Clinical Impressions(s) / UC Diagnoses   Final diagnoses:  UTI symptoms     Discharge Instructions      A urine culture is pending to confirm whether or not you have a urinary tract infection.  If the result of the culture is negative, you will be contacted and advised to stop the antibiotic. Take medication as prescribed. Increase fluids.  Try to drink at least 8-10 8 ounce glasses of water daily while symptoms persist. Develop a toileting schedule that will allow you to urinate every 2 hours. May take over-the-counter Tylenol as needed for pain, fever, or general discomfort. Avoid caffeine such as tea, soda, and coffee while symptoms persist. Go to the emergency department immediately if you experience fever, chills, or worsening urinary symptoms. If your urine culture is negative, and you are continuing to  experience symptoms, please follow-up with your primary care physician for further evaluation. Follow-up as needed.      ED Prescriptions     Medication Sig Dispense Auth. Provider   sulfamethoxazole-trimethoprim (BACTRIM DS) 800-160 MG tablet Take 1 tablet by mouth 2 (two) times daily for 7 days. 14 tablet Mosetta Ferdinand-Warren, Sadie Haber, NP      PDMP not reviewed this encounter.    Abran Cantor, NP 04/11/23 1336

## 2023-04-11 NOTE — Discharge Instructions (Addendum)
A urine culture is pending to confirm whether or not you have a urinary tract infection.  If the result of the culture is negative, you will be contacted and advised to stop the antibiotic. Take medication as prescribed. Increase fluids.  Try to drink at least 8-10 8 ounce glasses of water daily while symptoms persist. Develop a toileting schedule that will allow you to urinate every 2 hours. May take over-the-counter Tylenol as needed for pain, fever, or general discomfort. Avoid caffeine such as tea, soda, and coffee while symptoms persist. Go to the emergency department immediately if you experience fever, chills, or worsening urinary symptoms. If your urine culture is negative, and you are continuing to experience symptoms, please follow-up with your primary care physician for further evaluation. Follow-up as needed.

## 2023-04-11 NOTE — ED Triage Notes (Signed)
Lower ABD pain and burning on urination and lower back pain since Thursday.

## 2023-04-12 LAB — URINE CULTURE: Culture: <10000 — AB

## 2023-04-21 ENCOUNTER — Encounter: Payer: Self-pay | Admitting: Internal Medicine

## 2023-06-11 ENCOUNTER — Telehealth: Payer: Self-pay | Admitting: Internal Medicine

## 2023-06-11 NOTE — Telephone Encounter (Signed)
No, if any blood work is needed Dr. Jena Gauss will have her go after her appointment.

## 2023-06-11 NOTE — Telephone Encounter (Signed)
Patient has been scheduled for a follow up appt on Sept. 20 and she was asking if she needs to get blood work done.

## 2023-06-15 ENCOUNTER — Other Ambulatory Visit: Payer: Self-pay

## 2023-06-15 DIAGNOSIS — K760 Fatty (change of) liver, not elsewhere classified: Secondary | ICD-10-CM

## 2023-06-15 DIAGNOSIS — K7469 Other cirrhosis of liver: Secondary | ICD-10-CM

## 2023-06-15 DIAGNOSIS — R7989 Other specified abnormal findings of blood chemistry: Secondary | ICD-10-CM

## 2023-06-25 DIAGNOSIS — K7469 Other cirrhosis of liver: Secondary | ICD-10-CM | POA: Diagnosis not present

## 2023-06-25 DIAGNOSIS — R7989 Other specified abnormal findings of blood chemistry: Secondary | ICD-10-CM | POA: Diagnosis not present

## 2023-06-25 DIAGNOSIS — K760 Fatty (change of) liver, not elsewhere classified: Secondary | ICD-10-CM | POA: Diagnosis not present

## 2023-06-26 ENCOUNTER — Ambulatory Visit: Payer: 59 | Admitting: Internal Medicine

## 2023-06-26 ENCOUNTER — Encounter: Payer: Self-pay | Admitting: Internal Medicine

## 2023-06-26 VITALS — BP 144/81 | HR 80 | Temp 98.2°F | Ht 59.0 in | Wt 149.0 lb

## 2023-06-26 DIAGNOSIS — K7469 Other cirrhosis of liver: Secondary | ICD-10-CM

## 2023-06-26 LAB — HEPATIC FUNCTION PANEL
ALT: 43 IU/L — ABNORMAL HIGH (ref 0–32)
AST: 40 IU/L (ref 0–40)
Albumin: 4.6 g/dL (ref 3.8–4.9)
Alkaline Phosphatase: 53 IU/L (ref 44–121)
Bilirubin Total: 0.7 mg/dL (ref 0.0–1.2)
Bilirubin, Direct: 0.21 mg/dL (ref 0.00–0.40)
Total Protein: 6.9 g/dL (ref 6.0–8.5)

## 2023-06-26 NOTE — Progress Notes (Unsigned)
Primary Care Physician:  Elfredia Nevins, MD Primary Gastroenterologist:  Dr. Jena Gauss  Pre-Procedure History & Physical: HPI:  Caroline Solis is a 62 y.o. female here for follow-up MASH.  MRCP negative for splenomegaly or changes of cirrhosis.  Elastography normal PKA.  Changes consistent with portal gastropathy on prior EGD likely related to H. pylori which was treated and eradication proven.  She has lost 3 pounds since her last visit.  Hemoglobin A1c's in the 7 range.  She is centrally obese.  She started to drink coffee daily.  Trying to get exercise.  ALT recently found to be 43; never been over 50.   Negative screening colonoscopy recently; repeat planned for 10 years out. Past Medical History:  Diagnosis Date   Cirrhosis (HCC)    likely secondary to fatty liver. Completed Hep A and B vaccination in 2022.   Diabetes mellitus without complication (HCC)    Helicobacter pylori gastritis 05/2021   August 2022 (via EGD) s/p treatment with Prevpac   HLD (hyperlipidemia)    Hypertension     Past Surgical History:  Procedure Laterality Date   BIOPSY  06/03/2021   Procedure: BIOPSY;  Surgeon: Corbin Ade, MD;  [facility];  Service: Endoscopy;;   c-section X 2     COLONOSCOPY  2014   [facility];  External and internal hemorrhoids.  A few diminutive polyps in the rectum, in the sigmoid colon, in the transverse colon, and in the ascending colon biopsy. Pathology was benign. Repeat in 10 years.   COLONOSCOPY WITH PROPOFOL N/A 01/29/2023   Procedure: COLONOSCOPY WITH PROPOFOL;  Surgeon: Corbin Ade, MD;  [facility];  Service: Endoscopy;  Laterality: N/A;  10;45 am   ESOPHAGOGASTRODUODENOSCOPY (EGD) WITH PROPOFOL N/A 06/03/2021   Surgeon: Corbin Ade, MD;  normal esophagus, portal hypertensive gastropathy, gastric erosions s/p biopsy, normal examined duodenum.  Pathology was positive for H. pylori gastritis.   right ankle surgery  08/2020   screws and plate  in place    Prior to Admission medications   Medication Sig Start Date End Date Taking? Authorizing Provider  acetaminophen (TYLENOL) 500 MG tablet Take 1,000 mg by mouth every 6 (six) hours as needed for mild pain or moderate pain.   Yes [provider]  aspirin EC 81 MG tablet Take 81 mg by mouth daily. Swallow whole.   Yes [provider]  atorvastatin (LIPITOR) 20 MG tablet Take 20 mg by mouth daily.   Yes [provider]  BIOTIN 5000 PO Take 2 capsules by mouth daily.   Yes [provider]  Cholecalciferol (VITAMIN D3) 125 MCG (5000 UT) TABS Take 5,000 Units by mouth daily.   Yes [provider]  Coenzyme Q10 (CO Q 10) 100 MG CAPS Take 200 mg by mouth at bedtime.   Yes [provider]  enalapril (VASOTEC) 20 MG tablet Take 20 mg by mouth daily. 12/19/20  Yes [provider]  hydrochlorothiazide (HYDRODIURIL) 25 MG tablet Take 25 mg by mouth daily. 12/19/20  Yes [provider]  ibuprofen (ADVIL,MOTRIN) 200 MG tablet Take 400-800 mg by mouth every 6 (six) hours as needed for headache or moderate pain.   Yes [provider]  metFORMIN (GLUCOPHAGE-XR) 500 MG 24 hr tablet Take 1,000 mg by mouth 2 (two) times daily. 10/04/15  Yes [provider]  Omega-3 Fatty Acids (FISH OIL) 1000 MG CAPS Take 3,000 mg by mouth daily.   Yes [provider]  OVER THE COUNTER MEDICATION Natures craft liver support 2 capsules once daily   Yes [provider]  vitamin k 100 MCG tablet Take 100 mcg by mouth daily.   Yes [provider]  Zinc 50 MG TABS Take 50 mg by mouth daily.   Yes [provider]    Allergies as of 06/26/2023   (No Known Allergies)    Family History  Problem Relation Age of Onset   Cirrhosis Father        secondary to alcohol   Colon cancer Neg Hx     Social History   Socioeconomic History   Marital status: Married    Spouse name: Not on file   Number of  children: Not on file   Years of education: Not on file   Highest education level: Not on file  Occupational History   Not on file  Tobacco Use   Smoking status: Never   Smokeless tobacco: Never  Vaping Use   Vaping status: Never Used  Substance and Sexual Activity   Alcohol use: No   Drug use: No   Sexual activity: Yes  Other Topics Concern   Not on file  Social History Narrative   Not on file   Social Determinants of Health   Financial Resource Strain: Low Risk  (08/15/2020)   Received from [facility] visits prior to 12/06/2022., [facility] visits prior to 12/06/2022.   Overall Financial Resource Strain (CARDIA)    Difficulty of Paying Living Expenses: Not hard at all  Food Insecurity: Unknown (08/15/2020)   Received from [facility] visits prior to 12/06/2022., [facility] visits prior to 12/06/2022.   Hunger Vital Sign    Worried About Running Out of Food in the Last Year: Never true    Ran Out of Food in the Last Year: Not on file  Transportation Needs: Unknown (08/15/2020)   [facility] prior to 12/06/2022., [facility] visits prior to 12/06/2022.   PRAPARE - Administrator, Civil Service (Medical): No    Lack of Transportation (Non-Medical): Not on file  Physical Activity: Unknown (08/15/2020)   [facility] prior to 12/06/2022., [facility] visits prior to 12/06/2022.   Exercise Vital Sign    Days of Exercise per Week: 0 days    Minutes of Exercise per Session: Not on file  Stress: No Stress Concern Present (08/15/2020)   [facility] visits prior to 12/06/2022., [facility] visits prior to 12/06/2022.   [employer] of Occupational Health - Occupational Stress Questionnaire    Feeling of Stress :  Not at all  Social Connections: Unknown (08/15/2020)   [facility] visits prior to 12/06/2022., [facility] visits prior to 12/06/2022.   Social Connection and Isolation Panel [NHANES]    Frequency of Communication with Friends and Family: Three times a week    Frequency of Social Gatherings with Friends and Family: Not on file    Attends Religious Services: Not on file    Active Member of Clubs or Organizations: Not on file    Attends Banker Meetings: Not on file    Marital Status: Married  Intimate Partner Violence: Unknown (08/15/2020)   Received from [facility] visits prior to 12/06/2022., [facility] visits prior to 12/06/2022.   Humiliation, Afraid, Rape, and Kick questionnaire    Fear of Current or Ex-Partner: No    Emotionally Abused: Not on file    Physically Abused: Not on file    Sexually Abused: Not on file    Review of Systems: See HPI, otherwise negative ROS  Physical Exam: BP (!) 144/81 (BP Location: Left Arm, Patient Position: Sitting, Cuff Size: Normal)   Pulse 80   Temp 98.2 F (36.8 C) (Oral)   Ht 4\' 11"  (1.499 m)   Wt 149 lb (67.6 kg)   LMP 03/31/2016   SpO2 98%   BMI 30.09 kg/m  General:   Alert,  Well-developed, well-nourished, pleasant and cooperative in NAD.  Centrally obese. Abdomen: Non-distended, normal bowel sounds.  Soft and nontender without appreciable mass or hepatosplenomegaly.   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  She has nothing to suggest advanced chronic liver disease on workup thus far.  H. pylori can mimic portal gastropathy.  I would discount this finding in the setting of H. pylori.  HP was treated and eradicated.  Based on MRI and elastography I doubt this lady has advanced fibrosis/cirrhosis.  I do not think we need to pursue cirrhosis care at this time.    Recommendations:  Our goal is weight loss and regular  exercise.    Strive for better control of diabetes.  Daily coffee consumption is good for the liver  At this time, I do not believe  advanced chronic liver disease is present.  Office visit in 1 year with repeat labs.   Notice: This dictation was prepared with Dragon dictation along with smaller phrase technology. Any transcriptional errors that result from this process are unintentional and may not be corrected upon review.

## 2023-06-26 NOTE — Patient Instructions (Signed)
It was good to see you again today!  Our goal is weight loss and regular exercise.    Strive for better control of diabetes.  Daily coffee consumption is good for your liver  At this time, I do not believe you have advanced chronic liver disease which is good.  Office visit in 1 year with repeat labs.

## 2023-07-22 ENCOUNTER — Ambulatory Visit (HOSPITAL_COMMUNITY)
Admission: RE | Admit: 2023-07-22 | Discharge: 2023-07-22 | Disposition: A | Discharge status: Home / Self Care | Payer: 59 | Source: Ambulatory Visit | Attending: Internal Medicine | Admitting: Internal Medicine

## 2023-07-22 ENCOUNTER — Other Ambulatory Visit (HOSPITAL_COMMUNITY): Payer: Self-pay | Admitting: Internal Medicine

## 2023-07-22 DIAGNOSIS — Z0001 Encounter for general adult medical examination with abnormal findings: Secondary | ICD-10-CM | POA: Diagnosis not present

## 2023-07-22 DIAGNOSIS — M7732 Calcaneal spur, left foot: Secondary | ICD-10-CM | POA: Diagnosis not present

## 2023-07-22 DIAGNOSIS — M79672 Pain in left foot: Secondary | ICD-10-CM | POA: Insufficient documentation

## 2023-07-22 DIAGNOSIS — I10 Essential (primary) hypertension: Secondary | ICD-10-CM | POA: Diagnosis not present

## 2023-07-22 DIAGNOSIS — E118 Type 2 diabetes mellitus with unspecified complications: Secondary | ICD-10-CM | POA: Diagnosis not present

## 2023-07-22 DIAGNOSIS — Z1331 Encounter for screening for depression: Secondary | ICD-10-CM | POA: Diagnosis not present

## 2023-07-22 DIAGNOSIS — Z9229 Personal history of other drug therapy: Secondary | ICD-10-CM | POA: Diagnosis not present

## 2023-07-22 DIAGNOSIS — M7989 Other specified soft tissue disorders: Secondary | ICD-10-CM | POA: Diagnosis not present

## 2023-07-22 DIAGNOSIS — E663 Overweight: Secondary | ICD-10-CM | POA: Diagnosis not present

## 2023-07-22 DIAGNOSIS — Z23 Encounter for immunization: Secondary | ICD-10-CM | POA: Diagnosis not present

## 2023-07-22 DIAGNOSIS — Z6828 Body mass index (BMI) 28.0-28.9, adult: Secondary | ICD-10-CM | POA: Diagnosis not present

## 2023-07-22 DIAGNOSIS — K7460 Unspecified cirrhosis of liver: Secondary | ICD-10-CM | POA: Diagnosis not present

## 2023-07-22 DIAGNOSIS — E1165 Type 2 diabetes mellitus with hyperglycemia: Secondary | ICD-10-CM | POA: Diagnosis not present

## 2023-08-31 DIAGNOSIS — M722 Plantar fascial fibromatosis: Secondary | ICD-10-CM | POA: Diagnosis not present

## 2023-08-31 DIAGNOSIS — M7662 Achilles tendinitis, left leg: Secondary | ICD-10-CM | POA: Diagnosis not present

## 2023-09-17 DIAGNOSIS — M7662 Achilles tendinitis, left leg: Secondary | ICD-10-CM | POA: Diagnosis not present

## 2023-10-05 DIAGNOSIS — M7662 Achilles tendinitis, left leg: Secondary | ICD-10-CM | POA: Diagnosis not present

## 2024-05-04 ENCOUNTER — Other Ambulatory Visit (HOSPITAL_COMMUNITY): Payer: Self-pay | Admitting: Internal Medicine

## 2024-05-04 DIAGNOSIS — Z1231 Encounter for screening mammogram for malignant neoplasm of breast: Secondary | ICD-10-CM

## 2024-05-16 ENCOUNTER — Ambulatory Visit (HOSPITAL_COMMUNITY)
Admission: RE | Admit: 2024-05-16 | Discharge: 2024-05-16 | Disposition: A | Discharge status: Home / Self Care | Source: Ambulatory Visit | Attending: Internal Medicine | Admitting: Internal Medicine

## 2024-05-16 DIAGNOSIS — Z1231 Encounter for screening mammogram for malignant neoplasm of breast: Secondary | ICD-10-CM | POA: Diagnosis present

## 2024-05-20 ENCOUNTER — Encounter: Payer: Self-pay | Admitting: Internal Medicine

## 2024-06-01 ENCOUNTER — Other Ambulatory Visit: Payer: Self-pay

## 2024-06-01 DIAGNOSIS — K7469 Other cirrhosis of liver: Secondary | ICD-10-CM

## 2024-06-09 ENCOUNTER — Ambulatory Visit
Admission: EM | Admit: 2024-06-09 | Discharge: 2024-06-09 | Disposition: A | Discharge status: Home / Self Care | Attending: Family Medicine | Admitting: Family Medicine

## 2024-06-09 ENCOUNTER — Encounter: Payer: Self-pay | Admitting: Emergency Medicine

## 2024-06-09 DIAGNOSIS — R739 Hyperglycemia, unspecified: Secondary | ICD-10-CM

## 2024-06-09 DIAGNOSIS — Z7984 Long term (current) use of oral hypoglycemic drugs: Secondary | ICD-10-CM | POA: Diagnosis not present

## 2024-06-09 DIAGNOSIS — R300 Dysuria: Secondary | ICD-10-CM

## 2024-06-09 DIAGNOSIS — E1165 Type 2 diabetes mellitus with hyperglycemia: Secondary | ICD-10-CM

## 2024-06-09 LAB — POCT URINE DIPSTICK
Bilirubin, UA: NEGATIVE
Glucose, UA: 100 mg/dL — AB
Leukocytes, UA: NEGATIVE
Nitrite, UA: NEGATIVE
Protein Ur, POC: NEGATIVE mg/dL
Spec Grav, UA: 1.02 (ref 1.010–1.025)
Urobilinogen, UA: 0.2 U/dL
pH, UA: 5.5 (ref 5.0–8.0)

## 2024-06-09 NOTE — Discharge Instructions (Signed)
 Drink plenty of water , keep your blood sugars in normal range as best as possible and follow-up for worsening or unresolving symptoms.  Your urinalysis is currently negative for any urinary tract infection but it is important to get rechecked if your symptoms worsen

## 2024-06-09 NOTE — ED Provider Notes (Signed)
 [facility]    CSN: [id number] Arrival date & time: 06/09/24  1740      History   Chief Complaint No chief complaint on file.   HPI Caroline Solis is a 63 y.o. female.   Patient presenting today with 2-day history of diffuse low back aching, intermittent dysuria.  Denies fever, chills, abdominal pain, nausea, vomiting, vaginal symptoms.  States she has not been drinking as much water  as usual lately and her blood sugars have been quite elevated.  Was seen by her PCP a week or so ago for her diabetes and in addition to her metformin was started on a second medication that she states excretes the urine but she does not remember the name.  Suspect an SGLT2 inhibitor.  So far not trying anything over-the-counter for symptoms.    Past Medical History:  Diagnosis Date   Cirrhosis (HCC)    likely secondary to fatty liver. Completed Hep A and B vaccination in 2022.   Diabetes mellitus without complication (HCC)    Helicobacter pylori gastritis 05/2021   August 2022 (via EGD) s/p treatment with Prevpac   HLD (hyperlipidemia)    Hypertension     Patient Active Problem List   Diagnosis Date Noted   H. pylori infection 10/24/2021   History of Helicobacter pylori infection 10/21/2021   History of colonic polyps 02/06/2021   Elevated LFTs 02/06/2021   Other cirrhosis of liver (HCC) 02/06/2021    Past Surgical History:  Procedure Laterality Date   BIOPSY  06/03/2021   Procedure: BIOPSY;  Surgeon: Shaaron Lamar HERO, MD;  [facility];  Service: Endoscopy;;   c-section X 2     COLONOSCOPY  2014   [facility];  External and internal hemorrhoids.  A few diminutive polyps in the rectum, in the sigmoid colon, in the transverse colon, and in the ascending colon biopsy. Pathology was benign. Repeat in 10 years.   COLONOSCOPY WITH PROPOFOL  N/A 01/29/2023   Procedure: COLONOSCOPY WITH PROPOFOL ;  Surgeon: Shaaron Lamar HERO, MD;  [facility]: [facility];  Service: Endoscopy;   Laterality: N/A;  10;45 am   ESOPHAGOGASTRODUODENOSCOPY (EGD) WITH PROPOFOL  N/A 06/03/2021   Surgeon: Shaaron Lamar HERO, MD;  normal esophagus, portal hypertensive gastropathy, gastric erosions s/p biopsy, normal examined duodenum.  Pathology was positive for H. pylori gastritis.   right ankle surgery  08/2020   screws and plate in place    OB History   No obstetric history on file.      Home Medications    Prior to Admission medications   Medication Sig Start Date End Date Taking? Authorizing Provider  acetaminophen  (TYLENOL ) 500 MG tablet Take 1,000 mg by mouth every 6 (six) hours as needed for mild pain or moderate pain.    [provider]  aspirin EC 81 MG tablet Take 81 mg by mouth daily. Swallow whole.    [provider]  atorvastatin (LIPITOR) 20 MG tablet Take 20 mg by mouth daily.    [provider]  BIOTIN 5000 PO Take 2 capsules by mouth daily.    [provider]  Cholecalciferol (VITAMIN D3) 125 MCG (5000 UT) TABS Take 5,000 Units by mouth daily.    [provider]  Coenzyme Q10 (CO Q 10) 100 MG CAPS Take 200 mg by mouth at bedtime.    [provider]  enalapril (VASOTEC) 20 MG tablet Take 20 mg by mouth daily. 12/19/20   [provider]  hydrochlorothiazide (HYDRODIURIL) 25  MG tablet Take 25 mg by mouth daily. 12/19/20   [provider]  ibuprofen (ADVIL,MOTRIN) 200 MG tablet Take 400-800 mg by mouth every 6 (six) hours as needed for headache or moderate pain.    [provider]  metFORMIN (GLUCOPHAGE-XR) 500 MG 24 hr tablet Take 1,000 mg by mouth 2 (two) times daily. 10/04/15   [provider]  Omega-3 Fatty Acids (FISH OIL) 1000 MG CAPS Take 3,000 mg by mouth daily.    [provider]  OVER THE COUNTER MEDICATION Natures craft liver support 2 capsules once daily    [provider]  Zinc 50 MG TABS Take 50 mg by mouth daily.    [provider]    Family  History Family History  Problem Relation Age of Onset   Cirrhosis Father        secondary to alcohol   Colon cancer Neg Hx     Social History Social History   Tobacco Use   Smoking status: Never   Smokeless tobacco: Never  Vaping Use   Vaping status: Never Used  Substance Use Topics   Alcohol use: No   Drug use: No     Allergies   Patient has no known allergies.   Review of Systems Review of Systems Per HPI  Physical Exam Triage Vital Signs ED Triage Vitals  Encounter Vitals Group     BP 06/09/24 1747 125/68     Girls Systolic BP Percentile --      Girls Diastolic BP Percentile --      Boys Systolic BP Percentile --      Boys Diastolic BP Percentile --      Pulse Rate 06/09/24 1747 82     Resp 06/09/24 1747 18     Temp 06/09/24 1747 98.3 F (36.8 C)     Temp Source 06/09/24 1747 Oral     SpO2 06/09/24 1747 95 %     Weight --      Height --      Head Circumference --      Peak Flow --      Pain Score 06/09/24 1748 2     Pain Loc --      Pain Education --      Exclude from Growth Chart --    No data found.  Updated Vital Signs BP 125/68 (BP Location: Right Arm)   Pulse 82   Temp 98.3 F (36.8 C) (Oral)   Resp 18   LMP 03/31/2016   SpO2 95%   Visual Acuity Right Eye Distance:   Left Eye Distance:   Bilateral Distance:    Right Eye Near:   Left Eye Near:    Bilateral Near:     Physical Exam Vitals and nursing note reviewed.  Constitutional:      Appearance: Normal appearance. She is not ill-appearing.  HENT:     Head: Atraumatic.     Mouth/Throat:     Mouth: Mucous membranes are moist.  Eyes:     Extraocular Movements: Extraocular movements intact.     Conjunctiva/sclera: Conjunctivae normal.  Cardiovascular:     Rate and Rhythm: Normal rate and regular rhythm.     Heart sounds: Normal heart sounds.  Pulmonary:     Effort: Pulmonary effort is normal.     Breath sounds: Normal breath sounds.  Abdominal:     General: Bowel sounds  are normal. There is no distension.     Palpations: Abdomen is soft.  Tenderness: There is no abdominal tenderness. There is no right CVA tenderness, left CVA tenderness or guarding.  Musculoskeletal:        General: Normal range of motion.     Cervical back: Normal range of motion and neck supple.  Skin:    General: Skin is warm and dry.  Neurological:     Mental Status: She is alert and oriented to person, place, and time.  Psychiatric:        Mood and Affect: Mood normal.        Thought Content: Thought content normal.        Judgment: Judgment normal.      UC Treatments / Results  Labs (all labs ordered are listed, but only abnormal results are displayed) Labs Reviewed  POCT URINE DIPSTICK - Abnormal; Notable for the following components:      Result Value   Glucose, UA =100 (*)    Ketones, POC UA small (15) (*)    Blood, UA trace-intact (*)    All other components within normal limits    EKG   Radiology No results found.  Procedures Procedures (including critical care time)  Medications Ordered in UC Medications - No data to display  Initial Impression / Assessment and Plan / UC Course  I have reviewed the triage vital signs and the nursing notes.  Pertinent labs & imaging results that were available during my care of the patient were reviewed by me and considered in my medical decision making (see chart for details).     Vital signs and exam very reassuring today and within normal limits.  Urinalysis today without evidence of urinary tract infection, does have some small amounts of glucose in the urine which would track with starting an SGLT2 inhibitor recently though this has not been confirmed as she cannot remember the name and it is not available in epic.  She does have some trace blood and ketones.  Suspect urinary tract irritation, inflammation, possibly even some dehydration from hyperglycemia to be responsible for her symptoms at this time.  Push  fluids, continue close monitoring of blood sugars and dietary modifications, follow-up with PCP as scheduled next week for recheck.  Return sooner for worsening symptoms.  Final Clinical Impressions(s) / UC Diagnoses   Final diagnoses:  Dysuria  Hyperglycemia  Type 2 diabetes mellitus with hyperglycemia, without long-term current use of insulin (HCC)     Discharge Instructions      Drink plenty of water , keep your blood sugars in normal range as best as possible and follow-up for worsening or unresolving symptoms.  Your urinalysis is currently negative for any urinary tract infection but it is important to get rechecked if your symptoms worsen    ED Prescriptions   None    PDMP not reviewed this encounter.   Stuart Vernell Norris, [place] 06/09/24 [phone]

## 2024-06-09 NOTE — ED Triage Notes (Signed)
 Lower back pain x 2 days.  Burning on urination since yesterday.

## 2024-06-21 ENCOUNTER — Ambulatory Visit: Admitting: Internal Medicine

## 2024-07-01 ENCOUNTER — Ambulatory Visit: Payer: Self-pay | Admitting: Internal Medicine

## 2024-07-01 ENCOUNTER — Other Ambulatory Visit: Payer: Self-pay

## 2024-07-01 DIAGNOSIS — R7989 Other specified abnormal findings of blood chemistry: Secondary | ICD-10-CM

## 2024-07-01 DIAGNOSIS — K760 Fatty (change of) liver, not elsewhere classified: Secondary | ICD-10-CM

## 2024-07-01 LAB — HEPATIC FUNCTION PANEL
ALT: 45 IU/L — ABNORMAL HIGH (ref 0–32)
AST: 33 IU/L (ref 0–40)
Albumin: 4.3 g/dL (ref 3.9–4.9)
Alkaline Phosphatase: 62 IU/L (ref 49–135)
Bilirubin Total: 0.5 mg/dL (ref 0.0–1.2)
Bilirubin, Direct: 0.21 mg/dL (ref 0.00–0.40)
Total Protein: 6.9 g/dL (ref 6.0–8.5)

## 2024-07-01 LAB — PROTIME-INR
INR: 0.9 (ref 0.9–1.2)
Prothrombin Time: 10.3 s (ref 9.1–12.0)

## 2024-07-05 ENCOUNTER — Ambulatory Visit: Admitting: Internal Medicine

## 2024-07-05 ENCOUNTER — Encounter: Payer: Self-pay | Admitting: Internal Medicine

## 2024-07-05 VITALS — BP 125/74 | HR 81 | Temp 98.4°F | Ht 59.0 in | Wt 146.4 lb

## 2024-07-05 DIAGNOSIS — K760 Fatty (change of) liver, not elsewhere classified: Secondary | ICD-10-CM | POA: Diagnosis not present

## 2024-07-05 DIAGNOSIS — R7989 Other specified abnormal findings of blood chemistry: Secondary | ICD-10-CM

## 2024-07-05 NOTE — Patient Instructions (Addendum)
 It was good to see you again today  Congratulations on weight loss.  It would be wonderful if you could lose another 6 pounds within the next 6 months.  Regular exercise.  Continue drinking black coffee on a regular basis  Strive for optimal control of blood sugars.  When I get the ELF special test back I will let you know  Will plan to see you back in 6 months  Screening colonoscopy in 9 years

## 2024-07-05 NOTE — Progress Notes (Signed)
 Gastroenterology Progress Note    Primary Care Physician:  Bertell Satterfield, MD Primary Gastroenterologist:  Dr. Shaaron  Pre-Procedure History & Physical: HPI:  Caroline Solis is a 63 y.o. female here for follow-up of MASLD.  LFTs from last week slightly elevation SGPT which has been stable recent level 45.  All other parameters normal.  PKA 5.1 previously with high-quality elastography.  Suggestion of portal hypertensive gastropathy on EGD previously but it was determined she had H. pylori she was treated with eradication proven by stool antigen testing.  Poorly controlled diabetes recently hemoglobin A1c 9.1 she is working on getting that down medications have been changed.  She does take a statin as well.  Past Medical History:  Diagnosis Date   Cirrhosis (HCC)    likely secondary to fatty liver. Completed Hep A and B vaccination in 2022.   Diabetes mellitus without complication (HCC)    Helicobacter pylori gastritis 05/2021   August 2022 (via EGD) s/p treatment with Prevpac   HLD (hyperlipidemia)    Hypertension     Past Surgical History:  Procedure Laterality Date   BIOPSY  06/03/2021   Procedure: BIOPSY;  Surgeon: Shaaron Lamar HERO, MD;  [facility];  Service: Endoscopy;;   c-section X 2     COLONOSCOPY  2014   [facility];  External and internal hemorrhoids.  A few diminutive polyps in the rectum, in the sigmoid colon, in the transverse colon, and in the ascending colon biopsy. Pathology was benign. Repeat in 10 years.   COLONOSCOPY WITH PROPOFOL  N/A 01/29/2023   Procedure: COLONOSCOPY WITH PROPOFOL ;  Surgeon: Shaaron Lamar HERO, MD;  [facility];  Service: Endoscopy;  Laterality: N/A;  10;45 am   ESOPHAGOGASTRODUODENOSCOPY (EGD) WITH PROPOFOL  N/A 06/03/2021   Surgeon: Shaaron Lamar HERO, MD;  normal esophagus, portal hypertensive gastropathy, gastric erosions s/p biopsy, normal examined duodenum.  Pathology was positive for H. pylori gastritis.   right ankle  surgery  08/2020   screws and plate in place    Prior to Admission medications   Medication Sig Start Date End Date Taking? Authorizing Provider  acarbose (PRECOSE) 50 MG tablet Take 50 mg by mouth 3 (three) times daily. 05/03/24  Yes [provider]  acetaminophen  (TYLENOL ) 500 MG tablet Take 1,000 mg by mouth every 6 (six) hours as needed for mild pain or moderate pain.   Yes [provider]  aspirin EC 81 MG tablet Take 81 mg by mouth daily. Swallow whole.   Yes [provider]  atorvastatin (LIPITOR) 20 MG tablet Take 20 mg by mouth daily.   Yes [provider]  BIOTIN 5000 PO Take 2 capsules by mouth daily.   Yes [provider]  Cholecalciferol (VITAMIN D3) 125 MCG (5000 UT) TABS Take 5,000 Units by mouth daily.   Yes [provider]  Coenzyme Q10 (CO Q 10) 100 MG CAPS Take 200 mg by mouth at bedtime.   Yes [provider]  enalapril (VASOTEC) 20 MG tablet Take 20 mg by mouth daily. 12/19/20  Yes [provider]  hydrochlorothiazide (HYDRODIURIL) 25 MG tablet Take 25 mg by mouth daily. 12/19/20  Yes [provider]  ibuprofen (ADVIL,MOTRIN) 200 MG tablet Take 400-800 mg by mouth every 6 (six) hours as needed for headache or moderate pain.   Yes [provider]  metFORMIN (GLUCOPHAGE-XR) 500 MG 24 hr tablet Take 1,000 mg by mouth 2 (two) times daily. 10/04/15  Yes [provider]  Omega-3 Fatty Acids (FISH OIL) 1000 MG CAPS Take 3,000 mg by mouth daily.   Yes [provider]  OVER THE COUNTER MEDICATION Natures craft liver support 2 capsules once daily   Yes [provider]  Zinc 50 MG TABS Take 50 mg by mouth daily.   Yes [provider]    Allergies as of 07/05/2024   (No Known Allergies)    Family History  Problem Relation Age of Onset   Cirrhosis Father        secondary to alcohol   Colon cancer Neg Hx     Social History   Socioeconomic History    Marital status: Married    Spouse name: Not on file   Number of children: Not on file   Years of education: Not on file   Highest education level: Not on file  Occupational History   Not on file  Tobacco Use   Smoking status: Never   Smokeless tobacco: Never  Vaping Use   Vaping status: Never Used  Substance and Sexual Activity   Alcohol use: No   Drug use: No   Sexual activity: Yes  Other Topics Concern   Not on file  Social History Narrative   Not on file   Social Drivers of Health   Financial Resource Strain: Low Risk  (08/15/2020)   Received from [facility] visits prior to 12/06/2022.   Overall Financial Resource Strain (CARDIA)    Difficulty of Paying Living Expenses: Not hard at all  Food Insecurity: Unknown (08/15/2020)   Received from [facility] visits prior to 12/06/2022.   Hunger Vital Sign    Within the past 12 months, you worried that your food would run out before you got the money to buy more.: Never true    Ran Out of Food in the Last Year: Not on file  Transportation Needs: Unknown (08/15/2020)   Received from [facility] visits prior to 12/06/2022.   PRAPARE - Administrator, Civil Service (Medical): No    Lack of Transportation (Non-Medical): Not on file  Physical Activity: Unknown (08/15/2020)   Received from [facility] visits prior to 12/06/2022.   Exercise Vital Sign    On average, how many days per week do you engage in moderate to strenuous exercise (like a brisk walk)?: 0 days    Minutes of Exercise per Session: Not on file  Stress: No Stress Concern Present (08/15/2020)   Received from [facility] visits prior to 12/06/2022.   [employer] of Occupational Health - Occupational Stress Questionnaire    Feeling of Stress : Not at all  Social Connections: Unknown (08/15/2020)   Received from [facility] visits  prior to 12/06/2022.   Social Connection and Isolation Panel    In a typical week, how many times do you talk on the phone with family, friends, or neighbors?: Three times a week    Frequency of Social Gatherings with Friends and Family: Not on file    Attends Religious Services: Not on file    Active Member of Clubs or Organizations: Not on file    Attends Banker Meetings: Not on file    Are you married, widowed, divorced, separated, never married, or living with a partner?: Married  Intimate Partner Violence: Unknown (08/15/2020)   Received from [facility] visits prior to 12/06/2022.  Humiliation, Afraid, Rape, and Kick questionnaire    Within the last year, have you been afraid of your partner or ex-partner?: No    Emotionally Abused: Not on file    Physically Abused: Not on file    Sexually Abused: Not on file    Review of Systems   See HPI, otherwise negative ROS  Physical Exam: BP 125/74 (BP Location: Left Arm, Patient Position: Sitting, Cuff Size: Normal)   Pulse 81   Temp 98.4 F (36.9 C) (Oral)   Ht 4' 11 (1.499 m)   Wt 146 lb 6.4 oz (66.4 kg)   LMP 03/31/2016   SpO2 94%   BMI 29.57 kg/m  General:   Alert,   pleasant and cooperative in NAD; centrally obese Neck:  Supple; no masses or thyromegaly. No significant cervical adenopathy. Lungs:  Clear throughout to auscultation.   No wheezes, crackles, or rhonchi. No acute distress. Heart:  Regular rate and rhythm; no murmurs, clicks, rubs,  or gallops. Abdomen: Centrally obese.  Liver edge indistinct at the right lower costal  Extremities:  Without clubbing or edema.   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  Centrally obese poorly controlled type 2 diabetes mellitus.  She was congratulated on losing 6 pounds since her last office visit.  She does have hypodense liver which is large on CT scan.  I do not believe she has portal hypertension.  Prior elastography  measurement good.  ELF pending.  Repeatedly minimally elevated ALT.  Likely related to steatotic liver disease.  Could be a statin effect.  There is no need to stop statins or her occasional NSAID use at this time.  Recommendations: Congratulations on weight loss.  It would be wonderful if you could lose another 6 pounds within the next 6 months.  Regular exercise.  Continue drinking black coffee on a regular basis  Strive for optimal control of blood sugars.  When I get the ELF special test back I will let you know  Will plan to see you back in 6 months  Screening colonoscopy in 9 years  No need for FibroScan at this time but may consider a baseline without modality in the future.  Notice: This dictation was prepared with Dragon dictation along with smaller phrase technology. Any transcriptional errors that result from this process are unintentional and may not be corrected upon review.

## 2024-07-07 LAB — ENHANCED LIVER FIBROSIS (ELF): ELF(TM) Score: 9.93 — ABNORMAL HIGH (ref <9.80)

## 2024-07-17 ENCOUNTER — Ambulatory Visit: Payer: Self-pay | Admitting: Internal Medicine

## 2024-07-19 ENCOUNTER — Telehealth (INDEPENDENT_AMBULATORY_CARE_PROVIDER_SITE_OTHER): Payer: Self-pay

## 2024-07-19 ENCOUNTER — Other Ambulatory Visit: Payer: Self-pay | Admitting: *Deleted

## 2024-07-19 DIAGNOSIS — R7989 Other specified abnormal findings of blood chemistry: Secondary | ICD-10-CM

## 2024-07-19 DIAGNOSIS — K7469 Other cirrhosis of liver: Secondary | ICD-10-CM

## 2024-07-19 NOTE — Telephone Encounter (Signed)
 I spoke with the patient and made her aware to avoid NSAIDS, and went through a list of what those were. Patient states understanding.    Patient called to get clarification on the results she was called with yesterday. She says she was told to avoid a medication,but was unsure of what she was to avoid. See Dr. Shaaron message below.         ELF - moderate risk of progression.  Recommend better glycemic control; minimize use of NSAIDS.  Fibroscan between now and end of calendar year.

## 2024-07-22 ENCOUNTER — Ambulatory Visit: Admitting: Internal Medicine

## 2024-08-15 ENCOUNTER — Ambulatory Visit: Admitting: Nurse Practitioner

## 2024-10-27 ENCOUNTER — Telehealth: Payer: Self-pay

## 2024-10-27 NOTE — Telephone Encounter (Signed)
 Kindly schedule an appt

## 2024-10-27 NOTE — Telephone Encounter (Signed)
 Copied from CRM #[id number]. Topic: Clinical - Medication Refill >> Oct 27, 2024  9:58 AM Berwyn MATSU wrote: Medication:  enalapril (VASOTEC) 20 MG tablet; patient MD retired and she was not able to see him before. However she was scheduled to see Chelsea in 08/15/24 but then she left and her appointment was pushed to 11/16/24. Patient has no other MD that can refill her meds.   Has the patient contacted their pharmacy? Yes (Agent: If no, request that the patient contact the pharmacy for the refill. If patient does not wish to contact the pharmacy document the reason why and proceed with request.) (Agent: If yes, when and what did the pharmacy advise?)  This is the patient's preferred pharmacy:  [facility] [address], [place] - [address] [place] [address] Phone: [phone] Fax: [phone]  Is this the correct pharmacy for this prescription? Yes If no, delete pharmacy and type the correct one.   Has the prescription been filled recently? Yes  Is the patient out of the medication? Yes  Has the patient been seen for an appointment in the last year OR does the patient have an upcoming appointment? Yes  Can we respond through MyChart? No  Agent: Please be advised that Rx refills may take up to 3 business days. We ask that you follow-up with your pharmacy.

## 2024-10-28 NOTE — Telephone Encounter (Signed)
 Called left voicemail to call office to schedule an appointment for new patient.

## 2024-11-16 ENCOUNTER — Ambulatory Visit: Admitting: Family Medicine

## 2025-03-17 ENCOUNTER — Ambulatory Visit
